# Patient Record
Sex: Female | Born: 1970
Health system: Southern US, Community
[De-identification: ages and names within clinical notes are randomized; demographics above are authoritative.]

## PROBLEM LIST (undated history)

## (undated) DIAGNOSIS — G5603 Carpal tunnel syndrome, bilateral upper limbs: Secondary | ICD-10-CM

## (undated) DIAGNOSIS — M1990 Unspecified osteoarthritis, unspecified site: Secondary | ICD-10-CM

## (undated) DIAGNOSIS — T7840XA Allergy, unspecified, initial encounter: Secondary | ICD-10-CM

## (undated) HISTORY — DX: Carpal tunnel syndrome, bilateral upper limbs: G56.03

## (undated) HISTORY — DX: Unspecified osteoarthritis, unspecified site: M19.90

## (undated) HISTORY — PX: OTHER SURGICAL HISTORY: SHX169

## (undated) HISTORY — DX: Allergy, unspecified, initial encounter: T78.40XA

---

## 2002-12-13 ENCOUNTER — Encounter: Payer: Self-pay | Admitting: Unknown Physician Specialty

## 2002-12-13 ENCOUNTER — Ambulatory Visit (HOSPITAL_COMMUNITY): Admission: RE | Admit: 2002-12-13 | Discharge: 2002-12-13 | Payer: Self-pay | Admitting: Unknown Physician Specialty

## 2016-02-12 DIAGNOSIS — G5600 Carpal tunnel syndrome, unspecified upper limb: Secondary | ICD-10-CM | POA: Diagnosis not present

## 2016-07-19 ENCOUNTER — Ambulatory Visit (INDEPENDENT_AMBULATORY_CARE_PROVIDER_SITE_OTHER): Payer: BLUE CROSS/BLUE SHIELD | Admitting: Physician Assistant

## 2016-07-19 ENCOUNTER — Encounter: Payer: Self-pay | Admitting: Physician Assistant

## 2016-07-19 ENCOUNTER — Encounter (INDEPENDENT_AMBULATORY_CARE_PROVIDER_SITE_OTHER): Payer: Self-pay

## 2016-07-19 VITALS — BP 103/76 | HR 62 | Temp 97.3°F | Ht 61.0 in | Wt 246.6 lb

## 2016-07-19 DIAGNOSIS — Z23 Encounter for immunization: Secondary | ICD-10-CM

## 2016-07-19 DIAGNOSIS — A084 Viral intestinal infection, unspecified: Secondary | ICD-10-CM

## 2016-07-19 DIAGNOSIS — J301 Allergic rhinitis due to pollen: Secondary | ICD-10-CM

## 2016-07-19 NOTE — Patient Instructions (Signed)
DASH Eating Plan  DASH stands for "Dietary Approaches to Stop Hypertension." The DASH eating plan is a healthy eating plan that has been shown to reduce high blood pressure (hypertension). Additional health benefits may include reducing the risk of type 2 diabetes mellitus, heart disease, and stroke. The DASH eating plan may also help with weight loss.  WHAT DO I NEED TO KNOW ABOUT THE DASH EATING PLAN?  For the DASH eating plan, you will follow these general guidelines:  · Choose foods with a percent daily value for sodium of less than 5% (as listed on the food label).  · Use salt-free seasonings or herbs instead of table salt or sea salt.  · Check with your health care provider or pharmacist before using salt substitutes.  · Eat lower-sodium products, often labeled as "lower sodium" or "no salt added."  · Eat fresh foods.  · Eat more vegetables, fruits, and low-fat dairy products.  · Choose whole grains. Look for the word "whole" as the first word in the ingredient list.  · Choose fish and skinless chicken or turkey more often than red meat. Limit fish, poultry, and meat to 6 oz (170 g) each day.  · Limit sweets, desserts, sugars, and sugary drinks.  · Choose heart-healthy fats.  · Limit cheese to 1 oz (28 g) per day.  · Eat more home-cooked food and less restaurant, buffet, and fast food.  · Limit fried foods.  · Cook foods using methods other than frying.  · Limit canned vegetables. If you do use them, rinse them well to decrease the sodium.  · When eating at a restaurant, ask that your food be prepared with less salt, or no salt if possible.  WHAT FOODS CAN I EAT?  Seek help from a dietitian for individual calorie needs.  Grains  Whole grain or whole wheat bread. Brown rice. Whole grain or whole wheat pasta. Quinoa, bulgur, and whole grain cereals. Low-sodium cereals. Corn or whole wheat flour tortillas. Whole grain cornbread. Whole grain crackers. Low-sodium crackers.  Vegetables  Fresh or frozen vegetables  (raw, steamed, roasted, or grilled). Low-sodium or reduced-sodium tomato and vegetable juices. Low-sodium or reduced-sodium tomato sauce and paste. Low-sodium or reduced-sodium canned vegetables.   Fruits  All fresh, canned (in natural juice), or frozen fruits.  Meat and Other Protein Products  Ground beef (85% or leaner), grass-fed beef, or beef trimmed of fat. Skinless chicken or turkey. Ground chicken or turkey. Pork trimmed of fat. All fish and seafood. Eggs. Dried beans, peas, or lentils. Unsalted nuts and seeds. Unsalted canned beans.  Dairy  Low-fat dairy products, such as skim or 1% milk, 2% or reduced-fat cheeses, low-fat ricotta or cottage cheese, or plain low-fat yogurt. Low-sodium or reduced-sodium cheeses.  Fats and Oils  Tub margarines without trans fats. Light or reduced-fat mayonnaise and salad dressings (reduced sodium). Avocado. Safflower, olive, or canola oils. Natural peanut or almond butter.  Other  Unsalted popcorn and pretzels.  The items listed above may not be a complete list of recommended foods or beverages. Contact your dietitian for more options.  WHAT FOODS ARE NOT RECOMMENDED?  Grains  White bread. White pasta. White rice. Refined cornbread. Bagels and croissants. Crackers that contain trans fat.  Vegetables  Creamed or fried vegetables. Vegetables in a cheese sauce. Regular canned vegetables. Regular canned tomato sauce and paste. Regular tomato and vegetable juices.  Fruits  Dried fruits. Canned fruit in light or heavy syrup. Fruit juice.  Meat and Other Protein   Products  Fatty cuts of meat. Ribs, chicken wings, bacon, sausage, bologna, salami, chitterlings, fatback, hot dogs, bratwurst, and packaged luncheon meats. Salted nuts and seeds. Canned beans with salt.  Dairy  Whole or 2% milk, cream, half-and-half, and cream cheese. Whole-fat or sweetened yogurt. Full-fat cheeses or blue cheese. Nondairy creamers and whipped toppings. Processed cheese, cheese spreads, or cheese  curds.  Condiments  Onion and garlic salt, seasoned salt, table salt, and sea salt. Canned and packaged gravies. Worcestershire sauce. Tartar sauce. Barbecue sauce. Teriyaki sauce. Soy sauce, including reduced sodium. Steak sauce. Fish sauce. Oyster sauce. Cocktail sauce. Horseradish. Ketchup and mustard. Meat flavorings and tenderizers. Bouillon cubes. Hot sauce. Tabasco sauce. Marinades. Taco seasonings. Relishes.  Fats and Oils  Butter, stick margarine, lard, shortening, ghee, and bacon fat. Coconut, palm kernel, or palm oils. Regular salad dressings.  Other  Pickles and olives. Salted popcorn and pretzels.  The items listed above may not be a complete list of foods and beverages to avoid. Contact your dietitian for more information.  WHERE CAN I FIND MORE INFORMATION?  National Heart, Lung, and Blood Institute: www.nhlbi.nih.gov/health/health-topics/topics/dash/     This information is not intended to replace advice given to you by your health care provider. Make sure you discuss any questions you have with your health care provider.     Document Released: 09/16/2011 Document Revised: 10/18/2014 Document Reviewed: 08/01/2013  Elsevier Interactive Patient Education ©2016 Elsevier Inc.

## 2016-07-19 NOTE — Progress Notes (Signed)
BP 103/76   Pulse 62   Temp 97.3 F (36.3 C) (Oral)   Ht 5\' 1"  (1.549 m)   Wt 246 lb 9.6 oz (111.9 kg)   LMP 07/05/2016   BMI 46.59 kg/m    Subjective:    Patient ID: Erica Galloway, female    DOB: 08/08/1971, 45 y.o.   MRN: [id number]  Erica Galloway is a 45 y.o. female presenting on 07/19/2016 for Establish Care (Patient was out sick Wednesday at work with stomach virus. Discuss right ear )  HPI Patient here to be established as new patient at [facility].  This patient is known to me from [facility]. Had a stomach virus last week and had to leave work due to vomiting.  The day before she had several fever and chills episodes.  She is doing better now.  H  Otherwise she is doing well with medications and conditions.  She has no oher complaints today. Her hip and thumb pain have been controlled through exercise and resting.    Relevant past medical, surgical, family and social history reviewed and updated as indicated. Interim medical history since our last visit reviewed. Allergies and medications reviewed and updated.   Data reviewed from any sources in EPIC.  Review of Systems  Constitutional: Negative.  Negative for activity change, fatigue and fever.  HENT: Negative.  Negative for ear discharge and ear pain.   Eyes: Negative.   Respiratory: Negative.  Negative for cough.   Cardiovascular: Negative.  Negative for chest pain.  Gastrointestinal: Negative.  Negative for abdominal pain.  Endocrine: Negative.   Genitourinary: Negative.  Negative for dysuria.  Musculoskeletal: Positive for arthralgias, back pain and joint swelling.  Skin: Negative.   Neurological: Negative.     Per HPI unless specifically indicated above  Social History   Social History  . Marital status: Married    Spouse name: N/A  . Number of children: N/A  . Years of education: N/A   Occupational History  . Not on file.   Social History Main Topics  . Smoking  status: Never Smoker  . Smokeless tobacco: Never Used  . Alcohol use No  . Drug use: No  . Sexual activity: Not on file   Other Topics Concern  . Not on file   Social History Narrative  . No narrative on file    Past Surgical History:  Procedure Laterality Date  . right ankle surgery      Family History  Problem Relation Age of Onset  . Cancer Mother     Breast   . Hyperlipidemia Father       Medication List       Accurate as of 07/19/16 11:56 AM. Always use your most recent med list.          cetirizine 10 MG tablet Commonly known as:  ZYRTEC Take 10 mg by mouth daily.   clobetasol cream 0.05 % Commonly known as:  TEMOVATE Apply 1 application topically 2 (two) times daily.   fluticasone 50 MCG/ACT nasal spray Commonly known as:  FLONASE Place into both nostrils daily as needed for allergies or rhinitis.   PROBIOTIC-10 PO Take by mouth.   vitamin C 1000 MG tablet Take 1,000 mg by mouth daily.          Objective:    BP 103/76   Pulse 62   Temp 97.3 F (36.3 C) (Oral)   Ht 5\' 1"  (1.549 m)  Wt 246 lb 9.6 oz (111.9 kg)   LMP 07/05/2016   BMI 46.59 kg/m   Allergies  Allergen Reactions  . Sulfur    Wt Readings from Last 3 Encounters:  07/19/16 246 lb 9.6 oz (111.9 kg)    Physical Exam  Constitutional: She is oriented to person, place, and time. She appears well-developed and well-nourished.  HENT:  Head: Normocephalic and atraumatic.  Eyes: Conjunctivae and EOM are normal. Pupils are equal, round, and reactive to light.  Cardiovascular: Normal rate, regular rhythm, normal heart sounds and intact distal pulses.   Pulmonary/Chest: Effort normal and breath sounds normal.  Abdominal: Soft. Bowel sounds are normal. There is no tenderness. There is no guarding.  Musculoskeletal: She exhibits tenderness.  Neurological: She is alert and oriented to person, place, and time. She has normal reflexes.  Skin: Skin is warm and dry. No rash noted.    Psychiatric: She has a normal mood and affect. Her behavior is normal. Judgment and thought content normal.      Assessment & Plan:   1. Viral gastroenteritis Work note given, missed 07/14/16 due to vomitins  2. Allergic rhinitis due to pollen, unspecified chronicity, unspecified seasonality Continue flonase and zyrtec  3. Morbid obesity (HCC) DASH diet information given   Continue all other maintenance medications as listed above. Educational handout given for DASH diet  Follow up plan: Return if symptoms worsen or fail to improve.  Remus LofflerAngel S. Endora Teresi PA-C [facility] [address]  [place], [place] [address] [phone]   07/19/2016, 11:56 AM

## 2016-08-25 ENCOUNTER — Encounter: Payer: Self-pay | Admitting: Physician Assistant

## 2016-08-25 ENCOUNTER — Ambulatory Visit (INDEPENDENT_AMBULATORY_CARE_PROVIDER_SITE_OTHER): Payer: BLUE CROSS/BLUE SHIELD | Admitting: Physician Assistant

## 2016-08-25 VITALS — BP 108/69 | HR 84 | Temp 97.8°F | Ht 61.0 in | Wt 236.6 lb

## 2016-08-25 DIAGNOSIS — F902 Attention-deficit hyperactivity disorder, combined type: Secondary | ICD-10-CM | POA: Diagnosis not present

## 2016-08-25 MED ORDER — METHYLPHENIDATE HCL ER (OSM) 27 MG PO TBCR: 27.0000 mg | EXTENDED_RELEASE_TABLET | ORAL | 0 refills | Status: DC

## 2016-08-25 NOTE — Progress Notes (Signed)
BP 108/69   Pulse 84   Temp 97.8 F (36.6 C) (Oral)   Ht 5\' 1"  (1.549 m)   Wt 236 lb 9.6 oz (107.3 kg)   BMI 44.71 kg/m    Subjective:    Patient ID: Erica Galloway, female    DOB: 26-Oct-1970, 45 y.o.   MRN: [id number]  HPI: Erica Galloway is a 45 y.o. female presenting on 08/25/2016 for Medication Refill (Patient would like to discuss going back on concerta )  This patient has a positive diagnosis for ADHD. She had worked a job where she did not have to have such precise activities and the attention to detail as she does now. Therefore she has not been sent for the past couple years. That she feels that she needs to restart it. She has gotten some complaints from her managers at work about her following instructions and completing things in a certain way and with accuracy in measurements. There are no other complaints at this time  Relevant past medical, surgical, family and social history reviewed and updated as indicated. Allergies and medications reviewed and updated.  Past Medical History:  Diagnosis Date  . Allergy   . Arthritis   . Carpal tunnel syndrome, bilateral     Past Surgical History:  Procedure Laterality Date  . right ankle surgery      Review of Systems  Constitutional: Negative.  Negative for activity change, fatigue and fever.  HENT: Negative.   Eyes: Negative.   Respiratory: Negative.  Negative for cough.   Cardiovascular: Negative.  Negative for chest pain.  Gastrointestinal: Negative.  Negative for abdominal pain.  Endocrine: Negative.   Genitourinary: Negative.  Negative for dysuria.  Musculoskeletal: Negative.   Skin: Negative.   Neurological: Negative.   Psychiatric/Behavioral: Positive for decreased concentration. Negative for behavioral problems and confusion.      Medication List       Accurate as of 08/25/16  4:24 PM. Always use your most recent med list.          cetirizine 10 MG tablet Commonly known as:  ZYRTEC Take 10 mg by  mouth daily.   clobetasol cream 0.05 % Commonly known as:  TEMOVATE Apply 1 application topically 2 (two) times daily.   fluticasone 50 MCG/ACT nasal spray Commonly known as:  FLONASE Place into both nostrils daily as needed for allergies or rhinitis.   methylphenidate 27 MG CR tablet Commonly known as:  CONCERTA Take 1-2 tablets (27-54 mg total) by mouth every morning.   PROBIOTIC-10 PO Take by mouth.   vitamin C 1000 MG tablet Take 1,000 mg by mouth daily.          Objective:    BP 108/69   Pulse 84   Temp 97.8 F (36.6 C) (Oral)   Ht 5\' 1"  (1.549 m)   Wt 236 lb 9.6 oz (107.3 kg)   BMI 44.71 kg/m   Allergies  Allergen Reactions  . Sulfur     Physical Exam  Constitutional: She is oriented to person, place, and time. She appears well-developed and well-nourished.  HENT:  Head: Normocephalic and atraumatic.  Eyes: Conjunctivae and EOM are normal. Pupils are equal, round, and reactive to light.  Cardiovascular: Normal rate, regular rhythm, normal heart sounds and intact distal pulses.   Pulmonary/Chest: Effort normal and breath sounds normal.  Abdominal: Soft. Bowel sounds are normal.  Neurological: She is alert and oriented to person, place, and time. She has normal reflexes.  Skin: Skin  is warm and dry. No rash noted.  Psychiatric: She has a normal mood and affect. Her behavior is normal. Judgment and thought content normal.        Assessment & Plan:   1. Attention deficit hyperactivity disorder (ADHD), combined type - methylphenidate 27 MG PO CR tablet; Take 1-2 tablets (27-54 mg total) by mouth every morning.  Dispense: 60 tablet; Refill: 0 CONTRACT completed  Continue all other maintenance medications as listed above.  Follow up plan: Return in about 4 weeks (around 09/22/2016) for recheck meds.  Educational handout given for ADHD  Remus LofflerAngel S. Shawntay Prest PA-C [facility] [address]  [place], [place]  [address] [phone]   08/25/2016, 4:24 PM

## 2016-08-25 NOTE — Patient Instructions (Signed)

## 2016-09-21 ENCOUNTER — Ambulatory Visit (INDEPENDENT_AMBULATORY_CARE_PROVIDER_SITE_OTHER): Payer: BLUE CROSS/BLUE SHIELD | Admitting: Physician Assistant

## 2016-09-21 ENCOUNTER — Encounter: Payer: Self-pay | Admitting: Physician Assistant

## 2016-09-21 VITALS — BP 127/80 | HR 86 | Temp 98.3°F | Ht 61.0 in | Wt 224.0 lb

## 2016-09-21 DIAGNOSIS — F902 Attention-deficit hyperactivity disorder, combined type: Secondary | ICD-10-CM | POA: Diagnosis not present

## 2016-09-21 MED ORDER — METHYLPHENIDATE HCL ER (OSM) 54 MG PO TBCR: 54.0000 mg | EXTENDED_RELEASE_TABLET | ORAL | 0 refills | Status: DC

## 2016-09-21 NOTE — Progress Notes (Signed)
BP 127/80   Pulse 86   Temp 98.3 F (36.8 C) (Oral)   Ht 5\' 1"  (1.549 m)   Wt 224 lb (101.6 kg)   BMI 42.32 kg/m    Subjective:    Patient ID: Erica HopesKathy F Guerette, female    DOB: 05/20/1971, 45 y.o.   MRN: [id number]  HPI: Erica Galloway is a 45 y.o. female presenting on 09/21/2016 for Follow-up  This patient comes in for periodic recheck on medications and conditions of ADHD. All medications are reviewed today. There are no reports of any problems with the medications. All of the medical conditions are reviewed and updated.  Lab work is reviewed and will be ordered as medically necessary. There are no new problems reported with today's visit.    Relevant past medical, surgical, family and social history reviewed and updated as indicated. Allergies and medications reviewed and updated.  Past Medical History:  Diagnosis Date  . Allergy   . Arthritis   . Carpal tunnel syndrome, bilateral     Past Surgical History:  Procedure Laterality Date  . right ankle surgery      Review of Systems  Constitutional: Negative.  Negative for activity change, fatigue and fever.  HENT: Negative.   Eyes: Negative.   Respiratory: Negative.  Negative for cough.   Cardiovascular: Negative.  Negative for chest pain.  Gastrointestinal: Negative.  Negative for abdominal pain.  Endocrine: Negative.   Genitourinary: Negative.  Negative for dysuria.  Musculoskeletal: Negative.   Skin: Negative.   Neurological: Negative.       Medication List       Accurate as of 09/21/16  4:15 PM. Always use your most recent med list.          cetirizine 10 MG tablet Commonly known as:  ZYRTEC Take 10 mg by mouth daily.   clobetasol cream 0.05 % Commonly known as:  TEMOVATE Apply 1 application topically 2 (two) times daily.   fluticasone 50 MCG/ACT nasal spray Commonly known as:  FLONASE Place into both nostrils daily as needed for allergies or rhinitis.   methylphenidate 54 MG CR tablet Commonly  known as:  CONCERTA Take 1 tablet (54 mg total) by mouth every morning.   methylphenidate 54 MG CR tablet Commonly known as:  CONCERTA Take 1 tablet (54 mg total) by mouth every morning.   methylphenidate 54 MG CR tablet Commonly known as:  CONCERTA Take 1 tablet (54 mg total) by mouth every morning.   PROBIOTIC-10 PO Take by mouth.   vitamin C 1000 MG tablet Take 1,000 mg by mouth daily.          Objective:    BP 127/80   Pulse 86   Temp 98.3 F (36.8 C) (Oral)   Ht 5\' 1"  (1.549 m)   Wt 224 lb (101.6 kg)   BMI 42.32 kg/m   Allergies  Allergen Reactions  . Sulfur     Physical Exam  Constitutional: She is oriented to person, place, and time. She appears well-developed and well-nourished.  HENT:  Head: Normocephalic and atraumatic.  Eyes: Conjunctivae and EOM are normal. Pupils are equal, round, and reactive to light.  Cardiovascular: Normal rate, regular rhythm, normal heart sounds and intact distal pulses.   Pulmonary/Chest: Effort normal and breath sounds normal.  Abdominal: Soft. Bowel sounds are normal.  Neurological: She is alert and oriented to person, place, and time. She has normal reflexes.  Skin: Skin is warm and dry. No rash noted.  Psychiatric:  She has a normal mood and affect. Her behavior is normal. Judgment and thought content normal.       Assessment & Plan:   1. Attention deficit hyperactivity disorder (ADHD), combined type - methylphenidate 54 MG PO CR tablet; Take 1 tablet (54 mg total) by mouth every morning.  Dispense: 30 tablet; Refill: 0 - methylphenidate 54 MG PO CR tablet; Take 1 tablet (54 mg total) by mouth every morning.  Dispense: 30 tablet; Refill: 0 - methylphenidate 54 MG PO CR tablet; Take 1 tablet (54 mg total) by mouth every morning.  Dispense: 30 tablet; Refill: 0   Continue all other maintenance medications as listed above.  Follow up plan: Recheck 3 months.  Educational handout given for ADHD  Remus LofflerAngel S. Ellakate Gonsalves  PA-C [facility] [address]  [place], [place] [address] [phone]   09/21/2016, 4:15 PM

## 2016-09-21 NOTE — Patient Instructions (Signed)

## 2016-10-18 ENCOUNTER — Ambulatory Visit (INDEPENDENT_AMBULATORY_CARE_PROVIDER_SITE_OTHER): Payer: BLUE CROSS/BLUE SHIELD | Admitting: Physician Assistant

## 2016-10-18 ENCOUNTER — Encounter: Payer: Self-pay | Admitting: Physician Assistant

## 2016-10-18 VITALS — BP 121/77 | HR 93 | Temp 96.7°F | Ht 61.0 in | Wt 215.4 lb

## 2016-10-18 DIAGNOSIS — R059 Cough, unspecified: Secondary | ICD-10-CM | POA: Insufficient documentation

## 2016-10-18 DIAGNOSIS — R1114 Bilious vomiting: Secondary | ICD-10-CM | POA: Diagnosis not present

## 2016-10-18 DIAGNOSIS — J0100 Acute maxillary sinusitis, unspecified: Secondary | ICD-10-CM

## 2016-10-18 DIAGNOSIS — R05 Cough: Secondary | ICD-10-CM | POA: Diagnosis not present

## 2016-10-18 MED ORDER — HYDROCODONE-HOMATROPINE 5-1.5 MG/5ML PO SYRP: 5.0000 mL | ORAL_SOLUTION | Freq: Four times a day (QID) | ORAL | 0 refills | Status: DC | PRN

## 2016-10-18 MED ORDER — AMOXICILLIN 500 MG PO CAPS: 1000.0000 mg | ORAL_CAPSULE | Freq: Two times a day (BID) | ORAL | 0 refills | Status: DC

## 2016-10-18 MED ORDER — PROMETHAZINE HCL 25 MG PO TABS: 25.0000 mg | ORAL_TABLET | Freq: Four times a day (QID) | ORAL | 0 refills | Status: DC | PRN

## 2016-10-18 NOTE — Progress Notes (Signed)
BP 121/77   Pulse 93   Temp (!) 96.7 F (35.9 C) (Oral)   Ht 5\' 1"  (1.549 m)   Wt 215 lb 6.4 oz (97.7 kg)   BMI 40.70 kg/m    Subjective:    Patient ID: Erica Galloway, female    DOB: 1971/06/15, 46 y.o.   MRN: [id number]  HPI: Erica Galloway is a 46 y.o. female presenting on 10/18/2016 for Cough; Headache; and Emesis  Patient is an sick for approximately 4 days. She has had increased sinus pressure and drainage. She started the first couple days with severe nausea and vomiting. She felt that she was getting some better and was trying to go to work today. When she got there she had more vomiting. She has had some fever throughout. She states that the drainage is green in color. She denies any blood at this time. She is a very severe cough throughout the day.  Relevant past medical, surgical, family and social history reviewed and updated as indicated. Allergies and medications reviewed and updated.  Past Medical History:  Diagnosis Date  . Allergy   . Arthritis   . Carpal tunnel syndrome, bilateral     Past Surgical History:  Procedure Laterality Date  . right ankle surgery      Review of Systems  Constitutional: Positive for chills and fatigue. Negative for activity change and appetite change.  HENT: Positive for congestion, postnasal drip and sore throat.   Eyes: Negative.   Respiratory: Positive for cough and wheezing.   Cardiovascular: Negative.  Negative for chest pain, palpitations and leg swelling.  Gastrointestinal: Negative.   Genitourinary: Negative.   Musculoskeletal: Negative.   Skin: Negative.   Neurological: Positive for headaches.    Allergies as of 10/18/2016      Reactions   Sulfur       Medication List       Accurate as of 10/18/16  8:58 PM. Always use your most recent med list.          amoxicillin 500 MG capsule Commonly known as:  AMOXIL Take 2 capsules (1,000 mg total) by mouth 2 (two) times daily.   cetirizine 10 MG tablet Commonly  known as:  ZYRTEC Take 10 mg by mouth daily.   clobetasol cream 0.05 % Commonly known as:  TEMOVATE Apply 1 application topically 2 (two) times daily.   fluticasone 50 MCG/ACT nasal spray Commonly known as:  FLONASE Place into both nostrils daily as needed for allergies or rhinitis.   HYDROcodone-homatropine 5-1.5 MG/5ML syrup Commonly known as:  HYCODAN Take 5-10 mLs by mouth every 6 (six) hours as needed.   methylphenidate 54 MG CR tablet Commonly known as:  CONCERTA Take 1 tablet (54 mg total) by mouth every morning.   methylphenidate 54 MG CR tablet Commonly known as:  CONCERTA Take 1 tablet (54 mg total) by mouth every morning.   methylphenidate 54 MG CR tablet Commonly known as:  CONCERTA Take 1 tablet (54 mg total) by mouth every morning.   PROBIOTIC-10 PO Take by mouth.   promethazine 25 MG tablet Commonly known as:  PHENERGAN Take 1 tablet (25 mg total) by mouth every 6 (six) hours as needed for nausea or vomiting.   vitamin C 1000 MG tablet Take 1,000 mg by mouth daily.          Objective:    BP 121/77   Pulse 93   Temp (!) 96.7 F (35.9 C) (Oral)   Ht 5'  1" (1.549 m)   Wt 215 lb 6.4 oz (97.7 kg)   BMI 40.70 kg/m   Allergies  Allergen Reactions  . Sulfur     Physical Exam  Constitutional: She is oriented to person, place, and time. She appears well-developed and well-nourished.  HENT:  Head: Normocephalic and atraumatic.  Right Ear: Tympanic membrane and external ear normal. No middle ear effusion.  Left Ear: Tympanic membrane and external ear normal.  No middle ear effusion.  Nose: Mucosal edema and rhinorrhea present. Right sinus exhibits no maxillary sinus tenderness. Left sinus exhibits no maxillary sinus tenderness.  Mouth/Throat: Uvula is midline. Posterior oropharyngeal erythema present.  Eyes: Conjunctivae and EOM are normal. Pupils are equal, round, and reactive to light. Right eye exhibits no discharge. Left eye exhibits no  discharge.  Neck: Normal range of motion.  Cardiovascular: Normal rate, regular rhythm and normal heart sounds.   Pulmonary/Chest: Effort normal and breath sounds normal. No respiratory distress. She has no wheezes.  Abdominal: Soft.  Lymphadenopathy:    She has no cervical adenopathy.  Neurological: She is alert and oriented to person, place, and time.  Skin: Skin is warm and dry.  Psychiatric: She has a normal mood and affect.    No results found for this or any previous visit.    Assessment & Plan:   1. Acute non-recurrent maxillary sinusitis - amoxicillin (AMOXIL) 500 MG capsule; Take 2 capsules (1,000 mg total) by mouth 2 (two) times daily.  Dispense: 40 capsule; Refill: 0  2. Bilious vomiting with nausea - promethazine (PHENERGAN) 25 MG tablet; Take 1 tablet (25 mg total) by mouth every 6 (six) hours as needed for nausea or vomiting.  Dispense: 30 tablet; Refill: 0  3. Cough - HYDROcodone-homatropine (HYCODAN) 5-1.5 MG/5ML syrup; Take 5-10 mLs by mouth every 6 (six) hours as needed.  Dispense: 240 mL; Refill: 0   Continue all other maintenance medications as listed above.  Follow up plan: Return if symptoms worsen or fail to improve.  No orders of the defined types were placed in this encounter.   Educational handout given for sinusitis  Remus LofflerAngel S. Nyssa Sayegh PA-C [facility] [address]  [place], [place] [address] [phone]   10/18/2016, 8:58 PM

## 2016-10-18 NOTE — Patient Instructions (Signed)

## 2016-11-08 ENCOUNTER — Encounter: Payer: Self-pay | Admitting: Physician Assistant

## 2016-11-08 ENCOUNTER — Ambulatory Visit (INDEPENDENT_AMBULATORY_CARE_PROVIDER_SITE_OTHER): Payer: BLUE CROSS/BLUE SHIELD | Admitting: Physician Assistant

## 2016-11-08 VITALS — BP 123/75 | HR 92 | Temp 97.4°F | Ht 61.0 in | Wt 209.6 lb

## 2016-11-08 DIAGNOSIS — N898 Other specified noninflammatory disorders of vagina: Secondary | ICD-10-CM

## 2016-11-08 DIAGNOSIS — K649 Unspecified hemorrhoids: Secondary | ICD-10-CM | POA: Diagnosis not present

## 2016-11-08 LAB — WET PREP FOR TRICH, YEAST, CLUE
Clue Cell Exam: POSITIVE — AB
TRICHOMONAS EXAM: NEGATIVE
Yeast Exam: NEGATIVE

## 2016-11-08 MED ORDER — METRONIDAZOLE 500 MG PO TABS: 500.0000 mg | ORAL_TABLET | Freq: Three times a day (TID) | ORAL | 0 refills | Status: DC

## 2016-11-08 MED ORDER — HYDROCORTISONE ACETATE 25 MG RE SUPP: 25.0000 mg | Freq: Two times a day (BID) | RECTAL | 0 refills | Status: DC

## 2016-11-08 NOTE — Patient Instructions (Signed)
Hemorrhoids Hemorrhoids are swollen veins in and around the rectum or anus. There are two types of hemorrhoids:  Internal hemorrhoids. These occur in the veins that are just inside the rectum. They may poke through to the outside and become irritated and painful.  External hemorrhoids. These occur in the veins that are outside of the anus and can be felt as a painful swelling or hard lump near the anus.  Most hemorrhoids do not cause serious problems, and they can be managed with home treatments such as diet and lifestyle changes. If home treatments do not help your symptoms, procedures can be done to shrink or remove the hemorrhoids. What are the causes? This condition is caused by increased pressure in the anal area. This pressure may result from various things, including:  Constipation.  Straining to have a bowel movement.  Diarrhea.  Pregnancy.  Obesity.  Sitting for long periods of time.  Heavy lifting or other activity that causes you to strain.  Anal sex.  What are the signs or symptoms? Symptoms of this condition include:  Pain.  Anal itching or irritation.  Rectal bleeding.  Leakage of stool (feces).  Anal swelling.  One or more lumps around the anus.  How is this diagnosed? This condition can often be diagnosed through a visual exam. Other exams or tests may also be done, such as:  Examination of the rectal area with a gloved hand (digital rectal exam).  Examination of the anal canal using a small tube (anoscope).  A blood test, if you have lost a significant amount of blood.  A test to look inside the colon (sigmoidoscopy or colonoscopy).  How is this treated? This condition can usually be treated at home. However, various procedures may be done if dietary changes, lifestyle changes, and other home treatments do not help your symptoms. These procedures can help make the hemorrhoids smaller or remove them completely. Some of these procedures involve  surgery, and others do not. Common procedures include:  Rubber band ligation. Rubber bands are placed at the base of the hemorrhoids to cut off the blood supply to them.  Sclerotherapy. Medicine is injected into the hemorrhoids to shrink them.  Infrared coagulation. A type of light energy is used to get rid of the hemorrhoids.  Hemorrhoidectomy surgery. The hemorrhoids are surgically removed, and the veins that supply them are tied off.  Stapled hemorrhoidopexy surgery. A circular stapling device is used to remove the hemorrhoids and use staples to cut off the blood supply to them.  Follow these instructions at home: Eating and drinking  Eat foods that have a lot of fiber in them, such as whole grains, beans, nuts, fruits, and vegetables. Ask your health care provider about taking products that have added fiber (fiber supplements).  Drink enough fluid to keep your urine clear or pale yellow. Managing pain and swelling  Take warm sitz baths for 20 minutes, 3-4 times a day to ease pain and discomfort.  If directed, apply ice to the affected area. Using ice packs between sitz baths may be helpful. ? Put ice in a plastic bag. ? Place a towel between your skin and the bag. ? Leave the ice on for 20 minutes, 2-3 times a day. General instructions  Take over-the-counter and prescription medicines only as told by your health care provider.  Use medicated creams or suppositories as told.  Exercise regularly.  Go to the bathroom when you have the urge to have a bowel movement. Do not wait.    Avoid straining to have bowel movements.  Keep the anal area dry and clean. Use wet toilet paper or moist towelettes after a bowel movement.  Do not sit on the toilet for long periods of time. This increases blood pooling and pain. Contact a health care provider if:  You have increasing pain and swelling that are not controlled by treatment or medicine.  You have uncontrolled bleeding.  You  have difficulty having a bowel movement, or you are unable to have a bowel movement.  You have pain or inflammation outside the area of the hemorrhoids. This information is not intended to replace advice given to you by your health care provider. Make sure you discuss any questions you have with your health care provider. Document Released: 09/24/2000 Document Revised: 02/25/2016 Document Reviewed: 06/11/2015 Elsevier Interactive Patient Education  2017 Elsevier Inc.  

## 2016-11-10 NOTE — Progress Notes (Signed)
BP 123/75   Pulse 92   Temp 97.4 F (36.3 C) (Oral)   Ht 5\' 1"  (1.549 m)   Wt 209 lb 9.6 oz (95.1 kg)   BMI 39.60 kg/m    Subjective:    Patient ID: Erica Galloway, female    DOB: [birth year], 46 y.o.   MRN: [id number]  HPI: Erica Galloway is a 46 y.o. female presenting on 11/08/2016 for Vaginal Discharge Patient comes in with approximately 2 weeks of vaginal discharge. She stated she had a small amount before her menstrual cycle. During the week of her menstrual cycle it seemed to go away. Then since then she has had it come back again. She denies any fever or chills. She has some itching at times. In addition she has a small hemorrhoid that is aggravated when she has a bowel movement. She has had a distant history of hemorrhoid in the past  Relevant past medical, surgical, family and social history reviewed and updated as indicated. Allergies and medications reviewed and updated.  Past Medical History:  Diagnosis Date  . Allergy   . Arthritis   . Carpal tunnel syndrome, bilateral     Past Surgical History:  Procedure Laterality Date  . right ankle surgery      Review of Systems  Constitutional: Negative.  Negative for activity change, fatigue and fever.  HENT: Negative.   Eyes: Negative.   Respiratory: Negative.  Negative for cough.   Cardiovascular: Negative.  Negative for chest pain.  Gastrointestinal: Negative.  Negative for abdominal pain.  Endocrine: Negative.   Genitourinary: Negative.  Negative for dysuria.  Musculoskeletal: Negative.   Skin: Negative.   Neurological: Negative.     Allergies as of 11/08/2016      Reactions   Sulfur       Medication List       Accurate as of 11/08/16 11:59 PM. Always use your most recent med list.          cetirizine 10 MG tablet Commonly known as:  ZYRTEC Take 10 mg by mouth daily.   clobetasol cream 0.05 % Commonly known as:  TEMOVATE Apply 1 application topically 2 (two) times daily.   fluticasone 50 MCG/ACT  nasal spray Commonly known as:  FLONASE Place into both nostrils daily as needed for allergies or rhinitis.   HYDROcodone-homatropine 5-1.5 MG/5ML syrup Commonly known as:  HYCODAN Take 5-10 mLs by mouth every 6 (six) hours as needed.   hydrocortisone 25 MG suppository Commonly known as:  ANUSOL-HC Place 1 suppository (25 mg total) rectally 2 (two) times daily.   methylphenidate 54 MG CR tablet Commonly known as:  CONCERTA Take 1 tablet (54 mg total) by mouth every morning.   methylphenidate 54 MG CR tablet Commonly known as:  CONCERTA Take 1 tablet (54 mg total) by mouth every morning.   methylphenidate 54 MG CR tablet Commonly known as:  CONCERTA Take 1 tablet (54 mg total) by mouth every morning.   metroNIDAZOLE 500 MG tablet Commonly known as:  FLAGYL Take 1 tablet (500 mg total) by mouth 3 (three) times daily.   PROBIOTIC-10 PO Take by mouth.   promethazine 25 MG tablet Commonly known as:  PHENERGAN Take 1 tablet (25 mg total) by mouth every 6 (six) hours as needed for nausea or vomiting.   vitamin C 1000 MG tablet Take 1,000 mg by mouth daily.          Objective:    BP 123/75   Pulse 92  Temp 97.4 F (36.3 C) (Oral)   Ht 5\' 1"  (1.549 m)   Wt 209 lb 9.6 oz (95.1 kg)   BMI 39.60 kg/m   Allergies  Allergen Reactions  . Sulfur     Physical Exam  Constitutional: She is oriented to person, place, and time. She appears well-developed and well-nourished.  HENT:  Head: Normocephalic and atraumatic.  Eyes: Conjunctivae and EOM are normal. Pupils are equal, round, and reactive to light.  Cardiovascular: Normal rate, regular rhythm, normal heart sounds and intact distal pulses.   Pulmonary/Chest: Effort normal and breath sounds normal.  Abdominal: Soft. Bowel sounds are normal.  Neurological: She is alert and oriented to person, place, and time. She has normal reflexes.  Skin: Skin is warm and dry. No rash noted.  Psychiatric: She has a normal mood and  affect. Her behavior is normal. Judgment and thought content normal.    Results for orders placed or performed in visit on 11/08/16  WET PREP FOR TRICH, YEAST, CLUE  Result Value Ref Range   Trichomonas Exam Negative Negative   Yeast Exam Negative Negative   Clue Cell Exam Positive (A) Negative      Assessment & Plan:   1. Vaginal discharge - WET PREP FOR TRICH, YEAST, CLUE - metroNIDAZOLE (FLAGYL) 500 MG tablet; Take 1 tablet (500 mg total) by mouth 3 (three) times daily.  Dispense: 21 tablet; Refill: 0  2. Hemorrhoids, unspecified hemorrhoid type - hydrocortisone (ANUSOL-HC) 25 MG suppository; Place 1 suppository (25 mg total) rectally 2 (two) times daily.  Dispense: 12 suppository; Refill: 0   Continue all other maintenance medications as listed above.  Follow up plan: Return if symptoms worsen or fail to improve.  Orders Placed This Encounter  Procedures  . WET PREP FOR TRICH, YEAST, CLUE    Educational handout given for vaginitis  Remus LofflerAngel S. Kaj Vasil PA-C [facility] [address]  [place], [place] [address] [phone]   11/10/2016, 3:43 PM

## 2016-11-17 ENCOUNTER — Telehealth: Payer: Self-pay | Admitting: Physician Assistant

## 2016-11-17 MED ORDER — CLINDAMYCIN PHOSPHATE 2 % VA CREA: 1.0000 | TOPICAL_CREAM | Freq: Every day | VAGINAL | 0 refills | Status: AC

## 2016-11-17 NOTE — Telephone Encounter (Signed)
Detailed message left for patient that new rx sent to pharmacy.

## 2016-11-17 NOTE — Telephone Encounter (Signed)
Prescription sent to pharmacy.

## 2016-12-15 ENCOUNTER — Ambulatory Visit (INDEPENDENT_AMBULATORY_CARE_PROVIDER_SITE_OTHER): Payer: BLUE CROSS/BLUE SHIELD | Admitting: Physician Assistant

## 2016-12-15 ENCOUNTER — Encounter: Payer: Self-pay | Admitting: Physician Assistant

## 2016-12-15 VITALS — BP 101/70 | HR 84 | Temp 97.3°F | Ht 61.0 in | Wt 202.0 lb

## 2016-12-15 DIAGNOSIS — F902 Attention-deficit hyperactivity disorder, combined type: Secondary | ICD-10-CM

## 2016-12-15 MED ORDER — METHYLPHENIDATE HCL ER (OSM) 54 MG PO TBCR: 54.0000 mg | EXTENDED_RELEASE_TABLET | ORAL | 0 refills | Status: DC

## 2016-12-15 MED ORDER — METRONIDAZOLE 0.75 % VA GEL: 1.0000 | Freq: Two times a day (BID) | VAGINAL | 0 refills | Status: DC

## 2016-12-15 NOTE — Patient Instructions (Signed)
In a few days you may receive a survey in the mail or online from [employer] regarding your visit with us today. Please take a moment to fill this out. Your feedback is very important to our whole office. It can help us better understand your needs as well as improve your experience and satisfaction. Thank you for taking your time to complete it. We care about you.  Willona Phariss, PA-C  

## 2016-12-17 NOTE — Progress Notes (Signed)
BP 101/70   Pulse 84   Temp 97.3 F (36.3 C) (Oral)   Ht 5\' 1"  (1.549 m)   Wt 202 lb (91.6 kg)   BMI 38.17 kg/m    Subjective:    Patient ID: Erica Galloway, female    DOB: 1971/09/24, 46 y.o.   MRN: [id number]  HPI: BRYTNI Galloway is a 46 y.o. female presenting on 12/15/2016 for Follow-up and Medication Refill  This patient comes in for periodic recheck on medications and conditions including ADHD. Reports excellent response at work and in doing things at home. Good reports from quality at work.   All medications are reviewed today. There are no reports of any problems with the medications. All of the medical conditions are reviewed and updated.  Lab work is reviewed and will be ordered as medically necessary. There are no new problems reported with today's visit.   Relevant past medical, surgical, family and social history reviewed and updated as indicated. Allergies and medications reviewed and updated.  Past Medical History:  Diagnosis Date  . Allergy   . Arthritis   . Carpal tunnel syndrome, bilateral     Past Surgical History:  Procedure Laterality Date  . right ankle surgery      Review of Systems  Constitutional: Negative.  Negative for activity change, fatigue and fever.  HENT: Negative.   Eyes: Negative.   Respiratory: Negative.  Negative for cough.   Cardiovascular: Negative.  Negative for chest pain.  Gastrointestinal: Negative.  Negative for abdominal pain.  Endocrine: Negative.   Genitourinary: Negative.  Negative for dysuria.  Musculoskeletal: Negative.   Skin: Negative.   Neurological: Negative.     Allergies as of 12/15/2016      Reactions   Sulfur       Medication List       Accurate as of 12/15/16 11:59 PM. Always use your most recent med list.          cetirizine 10 MG tablet Commonly known as:  ZYRTEC Take 10 mg by mouth daily.   clobetasol cream 0.05 % Commonly known as:  TEMOVATE Apply 1 application topically 2 (two) times  daily.   fluticasone 50 MCG/ACT nasal spray Commonly known as:  FLONASE Place into both nostrils daily as needed for allergies or rhinitis.   hydrocortisone 25 MG suppository Commonly known as:  ANUSOL-HC Place 1 suppository (25 mg total) rectally 2 (two) times daily.   methylphenidate 54 MG CR tablet Commonly known as:  CONCERTA Take 1 tablet (54 mg total) by mouth every morning.   methylphenidate 54 MG CR tablet Commonly known as:  CONCERTA Take 1 tablet (54 mg total) by mouth every morning.   methylphenidate 54 MG CR tablet Commonly known as:  CONCERTA Take 1 tablet (54 mg total) by mouth every morning.   metroNIDAZOLE 0.75 % vaginal gel Commonly known as:  METROGEL Place 1 Applicatorful vaginally 2 (two) times daily.   PROBIOTIC-10 PO Take by mouth.   promethazine 25 MG tablet Commonly known as:  PHENERGAN Take 1 tablet (25 mg total) by mouth every 6 (six) hours as needed for nausea or vomiting.   vitamin C 1000 MG tablet Take 1,000 mg by mouth daily.          Objective:    BP 101/70   Pulse 84   Temp 97.3 F (36.3 C) (Oral)   Ht 5\' 1"  (1.549 m)   Wt 202 lb (91.6 kg)   BMI 38.17 kg/m  Allergies  Allergen Reactions  . Sulfur     Physical Exam  Constitutional: She is oriented to person, place, and time. She appears well-developed and well-nourished.  HENT:  Head: Normocephalic and atraumatic.  Eyes: Conjunctivae and EOM are normal. Pupils are equal, round, and reactive to light.  Cardiovascular: Normal rate, regular rhythm, normal heart sounds and intact distal pulses.   Pulmonary/Chest: Effort normal and breath sounds normal.  Abdominal: Soft. Bowel sounds are normal.  Neurological: She is alert and oriented to person, place, and time. She has normal reflexes.  Skin: Skin is warm and dry. No rash noted.  Psychiatric: She has a normal mood and affect. Her behavior is normal. Judgment and thought content normal.        Assessment & Plan:   1.  Attention deficit hyperactivity disorder (ADHD), combined type - methylphenidate 54 MG PO CR tablet; Take 1 tablet (54 mg total) by mouth every morning.  Dispense: 30 tablet; Refill: 0 - methylphenidate 54 MG PO CR tablet; Take 1 tablet (54 mg total) by mouth every morning.  Dispense: 30 tablet; Refill: 0 - methylphenidate 54 MG PO CR tablet; Take 1 tablet (54 mg total) by mouth every morning.  Dispense: 30 tablet; Refill: 0   Continue all other maintenance medications as listed above.  Follow up plan: Return in about 3 months (around 03/17/2017) for recheck.  Educational handout given for ADHD  Remus LofflerAngel S. Cissy Galbreath PA-C [facility] [address]  [place], [place] [address] [phone]   12/17/2016, 8:47 AM

## 2016-12-23 ENCOUNTER — Telehealth: Payer: Self-pay | Admitting: Physician Assistant

## 2017-01-24 ENCOUNTER — Ambulatory Visit (INDEPENDENT_AMBULATORY_CARE_PROVIDER_SITE_OTHER): Payer: BLUE CROSS/BLUE SHIELD | Admitting: Physician Assistant

## 2017-01-24 ENCOUNTER — Encounter: Payer: Self-pay | Admitting: Physician Assistant

## 2017-01-24 VITALS — BP 115/68 | HR 86 | Temp 97.8°F | Ht 61.0 in | Wt 201.6 lb

## 2017-01-24 DIAGNOSIS — G5603 Carpal tunnel syndrome, bilateral upper limbs: Secondary | ICD-10-CM | POA: Diagnosis not present

## 2017-01-24 DIAGNOSIS — M7552 Bursitis of left shoulder: Secondary | ICD-10-CM

## 2017-01-24 MED ORDER — NAPROXEN 500 MG PO TABS: 500.0000 mg | ORAL_TABLET | Freq: Two times a day (BID) | ORAL | 1 refills | Status: DC

## 2017-01-24 NOTE — Patient Instructions (Signed)
Bursitis Bursitis is when the fluid-filled sac (bursa) that covers and protects a joint is swollen (inflamed). Bursitis is most common near joints, especially the knees, elbows, hips, and shoulders. Follow these instructions at home:  Take medicines only as told by your doctor.  If you were prescribed an antibiotic medicine, finish it all even if you start to feel better.  Rest the affected area as told by your doctor.  Keep the area raised up.  Avoid doing things that make the pain worse.  Apply ice to the injured area:  Place ice in a plastic bag.  Place a towel between your skin and the bag.  Leave the ice on for 20 minutes, 2-3 times a day.  Use splints, braces, pads, or walking aids as told by your doctor.  Keep all follow-up visits as told by your doctor. This is important. Contact a doctor if:  You have more pain with home care.  You have a fever.  You have chills. This information is not intended to replace advice given to you by your health care provider. Make sure you discuss any questions you have with your health care provider. Document Released: 03/17/2010 Document Revised: 03/04/2016 Document Reviewed: 12/17/2013 Elsevier Interactive Patient Education  2017 Elsevier Inc.  

## 2017-01-24 NOTE — Progress Notes (Signed)
BP 115/68   Pulse 86   Temp 97.8 F (36.6 C) (Oral)   Ht  (1.549 m)   Wt 201 lb 9.6 oz (91.4 kg)   BMI 38.09 kg/m    Subjective:    Patient ID: MEA OZGA, female    DOB: [birth year], 46 y.o.   MRN: [id number]  HPI: Erica Galloway is a 46 y.o. female presenting on 01/24/2017 for shoulder and neck pain (and numbness)   She has known CTS, worse in the left hand, non-dominant. At work has to do a lot of manipulation and lifting with things over head.  No specific injury known, Worsened with lifting and with arm above 90 degrees.  Wakes up with left hand numb and pain up to above elbow. Pain specifically in the anterior shoulder and click can be felt with moving.  Relevant past medical, surgical, family and social history reviewed and updated as indicated. Allergies and medications reviewed and updated.  Past Medical History:  Diagnosis Date  . Allergy   . Arthritis   . Carpal tunnel syndrome, bilateral     Past Surgical History:  Procedure Laterality Date  . right ankle surgery      Review of Systems  Constitutional: Negative.   HENT: Negative.   Eyes: Negative.   Respiratory: Negative.   Gastrointestinal: Negative.   Genitourinary: Negative.   Musculoskeletal: Positive for arthralgias and myalgias. Negative for joint swelling, neck pain and neck stiffness.    Allergies as of 01/24/2017      Reactions   Sulfur       Medication List       Accurate as of 01/24/17  9:22 PM. Always use your most recent med list.          cetirizine 10 MG tablet Commonly known as:  ZYRTEC Take 10 mg by mouth daily.   clobetasol cream 0.05 % Commonly known as:  TEMOVATE Apply 1 application topically 2 (two) times daily.   fluticasone 50 MCG/ACT nasal spray Commonly known as:  FLONASE Place into both nostrils daily as needed for allergies or rhinitis.   hydrocortisone 25 MG suppository Commonly known as:  ANUSOL-HC Place 1 suppository (25 mg total) rectally 2 (two)  times daily.   methylphenidate 54 MG CR tablet Commonly known as:  CONCERTA Take 1 tablet (54 mg total) by mouth every morning.   methylphenidate 54 MG CR tablet Commonly known as:  CONCERTA Take 1 tablet (54 mg total) by mouth every morning.   methylphenidate 54 MG CR tablet Commonly known as:  CONCERTA Take 1 tablet (54 mg total) by mouth every morning.   metroNIDAZOLE 0.75 % vaginal gel Commonly known as:  METROGEL Place 1 Applicatorful vaginally 2 (two) times daily.   naproxen 500 MG tablet Commonly known as:  NAPROSYN Take 1 tablet (500 mg total) by mouth 2 (two) times daily with a meal.   PROBIOTIC-10 PO Take by mouth.   promethazine 25 MG tablet Commonly known as:  PHENERGAN Take 1 tablet (25 mg total) by mouth every 6 (six) hours as needed for nausea or vomiting.   vitamin C 1000 MG tablet Take 1,000 mg by mouth daily.          Objective:    BP 115/68   Pulse 86   Temp 97.8 F (36.6 C) (Oral)   Ht  (1.549 m)   Wt 201 lb 9.6 oz (91.4 kg)   BMI 38.09 kg/m   Allergies  Allergen Reactions  .  Sulfur     Physical Exam  Constitutional: She is oriented to person, place, and time. She appears well-developed and well-nourished.  HENT:  Head: Normocephalic and atraumatic.  Eyes: Conjunctivae and EOM are normal. Pupils are equal, round, and reactive to light.  Cardiovascular: Normal rate, regular rhythm, normal heart sounds and intact distal pulses.   Pulmonary/Chest: Effort normal and breath sounds normal.  Abdominal: Soft. Bowel sounds are normal.  Musculoskeletal: Normal range of motion. She exhibits tenderness. She exhibits no edema or deformity.       Left shoulder: She exhibits tenderness and crepitus. She exhibits no swelling and no effusion.       Left wrist: She exhibits tenderness. She exhibits normal range of motion, no effusion, no crepitus and no deformity.       Arms: Neurological: She is alert and oriented to person, place, and time. She  has normal reflexes.  Skin: Skin is warm and dry. No rash noted.  Psychiatric: She has a normal mood and affect. Her behavior is normal. Judgment and thought content normal.        Assessment & Plan:   1. Acute bursitis of left shoulder - Ambulatory referral to Orthopedic Surgery - naproxen (NAPROSYN) 500 MG tablet; Take 1 tablet (500 mg total) by mouth 2 (two) times daily with a meal.  Dispense: 60 tablet; Refill: 1 Rest  And ice when off work  2. Bilateral carpal tunnel syndrome - naproxen (NAPROSYN) 500 MG tablet; Take 1 tablet (500 mg total) by mouth 2 (two) times daily with a meal.  Dispense: 60 tablet; Refill: 1 WEAR BRACES as much as possible  Continue all other maintenance medications as listed above.  Follow up plan: Return if symptoms worsen or fail to improve.  Educational handout given for bursitis  Remus Loffler PA-C [facility] [address]  [place], [place] [address] [phone]   01/24/2017, 9:22 PM

## 2017-03-21 ENCOUNTER — Encounter: Payer: Self-pay | Admitting: Physician Assistant

## 2017-03-21 ENCOUNTER — Ambulatory Visit (INDEPENDENT_AMBULATORY_CARE_PROVIDER_SITE_OTHER): Payer: BLUE CROSS/BLUE SHIELD | Admitting: Physician Assistant

## 2017-03-21 VITALS — BP 90/59 | HR 77 | Temp 97.5°F | Ht 61.0 in | Wt 190.0 lb

## 2017-03-21 DIAGNOSIS — M25512 Pain in left shoulder: Secondary | ICD-10-CM

## 2017-03-21 DIAGNOSIS — F902 Attention-deficit hyperactivity disorder, combined type: Secondary | ICD-10-CM | POA: Diagnosis not present

## 2017-03-21 DIAGNOSIS — M7552 Bursitis of left shoulder: Secondary | ICD-10-CM | POA: Insufficient documentation

## 2017-03-21 DIAGNOSIS — G5603 Carpal tunnel syndrome, bilateral upper limbs: Secondary | ICD-10-CM | POA: Diagnosis not present

## 2017-03-21 DIAGNOSIS — G8929 Other chronic pain: Secondary | ICD-10-CM

## 2017-03-21 MED ORDER — BUPROPION HCL ER (XL) 150 MG PO TB24: 150.0000 mg | ORAL_TABLET | Freq: Every day | ORAL | 5 refills | Status: DC

## 2017-03-21 MED ORDER — METHYLPHENIDATE HCL ER (OSM) 54 MG PO TBCR: 54.0000 mg | EXTENDED_RELEASE_TABLET | ORAL | 0 refills | Status: DC

## 2017-03-21 MED ORDER — NAPROXEN 500 MG PO TABS: 500.0000 mg | ORAL_TABLET | Freq: Two times a day (BID) | ORAL | 1 refills | Status: DC

## 2017-03-21 NOTE — Patient Instructions (Signed)
In a few days you may receive a survey in the mail or online from [employer] regarding your visit with us today. Please take a moment to fill this out. Your feedback is very important to our whole office. It can help us better understand your needs as well as improve your experience and satisfaction. Thank you for taking your time to complete it. We care about you.  Shannon Kirkendall, PA-C  

## 2017-03-21 NOTE — Progress Notes (Signed)
BP (!) 90/59   Pulse 77   Temp 97.5 F (36.4 C) (Oral)   Ht 5\' 1"  (1.549 m)   Wt 190 lb (86.2 kg)   BMI 35.90 kg/m    Subjective:    Patient ID: Erica Galloway, female    DOB: 1971/04/22, 46 y.o.   MRN: [id number]  HPI: Erica Galloway is a 46 y.o. female presenting on 03/21/2017 for ADHD (pt here today for refills on her ADHD meds)  This patient comes in for periodic recheck on medications and conditions including ADHD, reports overall doing well. She has been somewhat anxiety and remembers taking wellbutrin with good results.  She is also having increased episodes of left shoulder pain after working. Has known carpal tunnel disorder.   All medications are reviewed today. There are no reports of any problems with the medications. All of the medical conditions are reviewed and updated.  Lab work is reviewed and will be ordered as medically necessary. There are no new problems reported with today's visit.   Relevant past medical, surgical, family and social history reviewed and updated as indicated. Allergies and medications reviewed and updated.  Past Medical History:  Diagnosis Date  . Allergy   . Arthritis   . Carpal tunnel syndrome, bilateral     Past Surgical History:  Procedure Laterality Date  . right ankle surgery      Review of Systems  Constitutional: Negative.  Negative for activity change, fatigue and fever.  HENT: Negative.   Eyes: Negative.   Respiratory: Negative.  Negative for cough.   Cardiovascular: Negative.  Negative for chest pain.  Gastrointestinal: Negative.  Negative for abdominal pain.  Endocrine: Negative.   Genitourinary: Negative.  Negative for dysuria.  Musculoskeletal: Positive for arthralgias and joint swelling. Negative for back pain.  Skin: Negative.   Neurological: Negative.   Psychiatric/Behavioral: Negative.     Allergies as of 03/21/2017      Reactions   Sulfur       Medication List       Accurate as of 03/21/17  8:52 PM.  Always use your most recent med list.          buPROPion 150 MG 24 hr tablet Commonly known as:  WELLBUTRIN XL Take 1 tablet (150 mg total) by mouth daily.   cetirizine 10 MG tablet Commonly known as:  ZYRTEC Take 10 mg by mouth daily.   fluticasone 50 MCG/ACT nasal spray Commonly known as:  FLONASE Place into both nostrils daily as needed for allergies or rhinitis.   methylphenidate 54 MG CR tablet Commonly known as:  CONCERTA Take 1 tablet (54 mg total) by mouth every morning.   methylphenidate 54 MG CR tablet Commonly known as:  CONCERTA Take 1 tablet (54 mg total) by mouth every morning.   methylphenidate 54 MG CR tablet Commonly known as:  CONCERTA Take 1 tablet (54 mg total) by mouth every morning.   naproxen 500 MG tablet Commonly known as:  NAPROSYN Take 1 tablet (500 mg total) by mouth 2 (two) times daily with a meal.   promethazine 25 MG tablet Commonly known as:  PHENERGAN Take 1 tablet (25 mg total) by mouth every 6 (six) hours as needed for nausea or vomiting.   vitamin C 1000 MG tablet Take 1,000 mg by mouth daily.          Objective:    BP (!) 90/59   Pulse 77   Temp 97.5 F (36.4 C) (Oral)  Ht 5\' 1"  (1.549 m)   Wt 190 lb (86.2 kg)   BMI 35.90 kg/m   Allergies  Allergen Reactions  . Sulfur     Physical Exam  Constitutional: She is oriented to person, place, and time. She appears well-developed and well-nourished.  HENT:  Head: Normocephalic and atraumatic.  Eyes: Conjunctivae and EOM are normal. Pupils are equal, round, and reactive to light.  Cardiovascular: Normal rate, regular rhythm, normal heart sounds and intact distal pulses.   Pulmonary/Chest: Effort normal and breath sounds normal.  Abdominal: Soft. Bowel sounds are normal.  Musculoskeletal:       Left shoulder: She exhibits decreased range of motion and tenderness. She exhibits no swelling, no effusion and no crepitus.  Neurological: She is alert and oriented to person,  place, and time. She has normal reflexes.  Skin: Skin is warm and dry. No rash noted.  Psychiatric: She has a normal mood and affect. Her behavior is normal. Judgment and thought content normal.  Nursing note and vitals reviewed.       Assessment & Plan:   1. Attention deficit hyperactivity disorder (ADHD), combined type - methylphenidate 54 MG PO CR tablet; Take 1 tablet (54 mg total) by mouth every morning.  Dispense: 30 tablet; Refill: 0 - methylphenidate 54 MG PO CR tablet; Take 1 tablet (54 mg total) by mouth every morning.  Dispense: 30 tablet; Refill: 0 - methylphenidate 54 MG PO CR tablet; Take 1 tablet (54 mg total) by mouth every morning.  Dispense: 30 tablet; Refill: 0 - buPROPion (WELLBUTRIN XL) 150 MG 24 hr tablet; Take 1 tablet (150 mg total) by mouth daily.  Dispense: 30 tablet; Refill: 5  2. Chronic left shoulder pain - naproxen (NAPROSYN) 500 MG tablet; Take 1 tablet (500 mg total) by mouth 2 (two) times daily with a meal.  Dispense: 60 tablet; Refill: 1 - Ambulatory referral to Orthopedic Surgery  3. Acute bursitis of left shoulder - naproxen (NAPROSYN) 500 MG tablet; Take 1 tablet (500 mg total) by mouth 2 (two) times daily with a meal.  Dispense: 60 tablet; Refill: 1  4. Bilateral carpal tunnel syndrome - naproxen (NAPROSYN) 500 MG tablet; Take 1 tablet (500 mg total) by mouth 2 (two) times daily with a meal.  Dispense: 60 tablet; Refill: 1   Continue all other maintenance medications as listed above.  Follow up plan: Return in about 3 months (around 06/21/2017) for recheck.  Educational handout given for survey  Remus LofflerAngel S. Talonda Artist PA-C [facility] [address]  [place], [place] [id number] [phone]   03/21/2017, 8:52 PM

## 2017-04-11 DIAGNOSIS — M7542 Impingement syndrome of left shoulder: Secondary | ICD-10-CM | POA: Diagnosis not present

## 2017-05-13 DIAGNOSIS — M7542 Impingement syndrome of left shoulder: Secondary | ICD-10-CM | POA: Diagnosis not present

## 2017-06-22 ENCOUNTER — Ambulatory Visit (INDEPENDENT_AMBULATORY_CARE_PROVIDER_SITE_OTHER): Payer: BLUE CROSS/BLUE SHIELD | Admitting: Physician Assistant

## 2017-06-22 ENCOUNTER — Encounter: Payer: Self-pay | Admitting: Physician Assistant

## 2017-06-22 VITALS — BP 95/62 | HR 77 | Temp 97.5°F | Ht 61.0 in | Wt 198.6 lb

## 2017-06-22 DIAGNOSIS — J0100 Acute maxillary sinusitis, unspecified: Secondary | ICD-10-CM | POA: Diagnosis not present

## 2017-06-22 DIAGNOSIS — Z77011 Contact with and (suspected) exposure to lead: Secondary | ICD-10-CM | POA: Diagnosis not present

## 2017-06-22 DIAGNOSIS — F902 Attention-deficit hyperactivity disorder, combined type: Secondary | ICD-10-CM

## 2017-06-22 MED ORDER — METHYLPHENIDATE HCL ER (OSM) 54 MG PO TBCR: 54.0000 mg | EXTENDED_RELEASE_TABLET | ORAL | 0 refills | Status: DC

## 2017-06-22 MED ORDER — AMOXICILLIN 500 MG PO CAPS: 1000.0000 mg | ORAL_CAPSULE | Freq: Two times a day (BID) | ORAL | 1 refills | Status: DC

## 2017-06-22 NOTE — Patient Instructions (Signed)
In a few days you may receive a survey in the mail or online from [employer] regarding your visit with us today. Please take a moment to fill this out. Your feedback is very important to our whole office. It can help us better understand your needs as well as improve your experience and satisfaction. Thank you for taking your time to complete it. We care about you.  Jaquise Faux, PA-C  

## 2017-06-22 NOTE — Progress Notes (Addendum)
BP 95/62   Pulse 77   Temp (!) 97.5 F (36.4 C) (Oral)   Ht  (1.549 m)   Wt 198 lb 9.6 oz (90.1 kg)   BMI 37.53 kg/m    Subjective:    Patient ID: Erica Galloway, female    DOB: April 07, 1971, 46 y.o.   MRN: [id number]  HPI: Erica Galloway is a 46 y.o. female presenting on 06/22/2017 for Follow-up and Nasal Congestion  This patient comes in for periodic recheck on medications and conditions including  ADHD and is with no issues. The patient also has significant lead exposure on her job. And is looking to have a clinical drawn. This patient has had many days of sinus headache and postnasal drainage. There is copious drainage at times. Denies any fever at this time. There has been a history of sinus infections in the past.  No history of sinus surgery. There is cough at night. It has become more prevalent in recent days..   All medications are reviewed today. There are no reports of any problems with the medications. All of the medical conditions are reviewed and updated.  Lab work is reviewed and will be ordered as medically necessary. There are no new problems reported with today's visit.   Relevant past medical, surgical, family and social history reviewed and updated as indicated. Allergies and medications reviewed and updated.  Past Medical History:  Diagnosis Date  . Allergy   . Arthritis   . Carpal tunnel syndrome, bilateral     Past Surgical History:  Procedure Laterality Date  . right ankle surgery      Review of Systems  Constitutional: Positive for chills and fatigue. Negative for activity change and appetite change.  HENT: Positive for congestion, postnasal drip, sinus pain, sinus pressure and sore throat.   Eyes: Negative.   Respiratory: Negative for cough and wheezing.   Cardiovascular: Negative.  Negative for chest pain, palpitations and leg swelling.  Gastrointestinal: Negative.   Genitourinary: Negative.   Musculoskeletal: Negative.   Skin: Negative.     Neurological: Positive for headaches.    Allergies as of 06/22/2017      Reactions   Sulfur       Medication List       Accurate as of 06/22/17  9:15 PM. Always use your most recent med list.          amoxicillin 500 MG capsule Commonly known as:  AMOXIL Take 2 capsules (1,000 mg total) by mouth 2 (two) times daily.   buPROPion 150 MG 24 hr tablet Commonly known as:  WELLBUTRIN XL Take 1 tablet (150 mg total) by mouth daily.   cetirizine 10 MG tablet Commonly known as:  ZYRTEC Take 10 mg by mouth daily.   fluticasone 50 MCG/ACT nasal spray Commonly known as:  FLONASE Place into both nostrils daily as needed for allergies or rhinitis.   methylphenidate 54 MG CR tablet Commonly known as:  CONCERTA Take 1 tablet (54 mg total) by mouth every morning.   methylphenidate 54 MG CR tablet Commonly known as:  CONCERTA Take 1 tablet (54 mg total) by mouth every morning.   methylphenidate 54 MG CR tablet Commonly known as:  CONCERTA Take 1 tablet (54 mg total) by mouth every morning.   vitamin C 1000 MG tablet Take 1,000 mg by mouth daily.            Discharge Care Instructions        Start  Ordered   06/22/17 0000  methylphenidate 54 MG PO CR tablet  BH-each morning    Question:  Supervising Provider  Answer:  Kevin FentonBRADSHAW, SAMUEL L   06/22/17 1609   06/22/17 0000  methylphenidate 54 MG PO CR tablet  BH-each morning    Comments:  Fill 30 days from original script date  Question:  Supervising Provider  Answer:  Elenora GammaBRADSHAW, SAMUEL L   06/22/17 1609   06/22/17 0000  methylphenidate 54 MG PO CR tablet  BH-each morning    Comments:  Fill 60 days from original script date  Question:  Supervising Provider  Answer:  Elenora GammaBRADSHAW, SAMUEL L   06/22/17 1609   06/22/17 0000  amoxicillin (AMOXIL) 500 MG capsule  2 times daily    Question:  Supervising Provider  Answer:  Elenora GammaBRADSHAW, SAMUEL L   06/22/17 1610   06/22/17 0000  Lead, blood    Question[place]  [place]   06/22/17 1620         Objective:    BP 95/62   Pulse 77   Temp (!) 97.5 F (36.4 C) (Oral)   Ht 5\' 1"  (1.549 m)   Wt 198 lb 9.6 oz (90.1 kg)   BMI 37.53 kg/m   Allergies  Allergen Reactions  . Sulfur     Physical Exam  Constitutional: She is oriented to person, place, and time. She appears well-developed and well-nourished.  HENT:  Head: Normocephalic and atraumatic.  Right Ear: Tympanic membrane and external ear normal. No middle ear effusion.  Left Ear: Tympanic membrane and external ear normal.  No middle ear effusion.  Nose: Mucosal edema and rhinorrhea present. Right sinus exhibits no maxillary sinus tenderness. Left sinus exhibits no maxillary sinus tenderness.  Mouth/Throat: Uvula is midline. Posterior oropharyngeal erythema present.  Eyes: Pupils are equal, round, and reactive to light. Conjunctivae and EOM are normal. Right eye exhibits no discharge. Left eye exhibits no discharge.  Neck: Normal range of motion.  Cardiovascular: Normal rate, regular rhythm and normal heart sounds.   Pulmonary/Chest: Effort normal and breath sounds normal. No respiratory distress. She has no wheezes.  Abdominal: Soft.  Lymphadenopathy:    She has no cervical adenopathy.  Neurological: She is alert and oriented to person, place, and time.  Skin: Skin is warm and dry.  Psychiatric: She has a normal mood and affect.    Results for orders placed or performed in visit on 11/08/16  WET PREP FOR TRICH, YEAST, CLUE  Result Value Ref Range   Trichomonas Exam Negative Negative   Yeast Exam Negative Negative   Clue Cell Exam Positive (A) Negative      Assessment & Plan:   1. Attention deficit hyperactivity disorder (ADHD), combined type - methylphenidate 54 MG PO CR tablet; Take 1 tablet (54 mg total) by mouth every morning.  Dispense: 30 tablet; Refill: 0 - methylphenidate 54 MG PO CR tablet; Take 1 tablet (54 mg total) by mouth every morning.  Dispense: 30 tablet;  Refill: 0 - methylphenidate 54 MG PO CR tablet; Take 1 tablet (54 mg total) by mouth every morning.  Dispense: 30 tablet; Refill: 0  2. Lead exposure risk assessment, high risk - Lead, blood  3. Acute non-recurrent maxillary sinusitis - amoxicillin (AMOXIL) 500 MG capsule; Take 2 capsules (1,000 mg total) by mouth 2 (two) times daily.  Dispense: 40 capsule; Refill: 1    Current Outpatient Prescriptions:  .  Ascorbic Acid (VITAMIN C) 1000 MG tablet, Take  1,000 mg by mouth daily., Disp: , Rfl:  .  buPROPion (WELLBUTRIN XL) 150 MG 24 hr tablet, Take 1 tablet (150 mg total) by mouth daily., Disp: 30 tablet, Rfl: 5 .  cetirizine (ZYRTEC) 10 MG tablet, Take 10 mg by mouth daily., Disp: , Rfl:  .  fluticasone (FLONASE) 50 MCG/ACT nasal spray, Place into both nostrils daily as needed for allergies or rhinitis., Disp: , Rfl:  .  methylphenidate 54 MG PO CR tablet, Take 1 tablet (54 mg total) by mouth every morning., Disp: 30 tablet, Rfl: 0 .  methylphenidate 54 MG PO CR tablet, Take 1 tablet (54 mg total) by mouth every morning., Disp: 30 tablet, Rfl: 0 .  methylphenidate 54 MG PO CR tablet, Take 1 tablet (54 mg total) by mouth every morning., Disp: 30 tablet, Rfl: 0 .  amoxicillin (AMOXIL) 500 MG capsule, Take 2 capsules (1,000 mg total) by mouth 2 (two) times daily., Disp: 40 capsule, Rfl: 1 Continue all other maintenance medications as listed above.  Follow up plan: Return in about 3 months (around 09/21/2017) for recheck.  Educational handout given for survey  Remus Loffler PA-C [facility] [address]  [place], [place] [address] [phone]   06/22/2017, 9:15 PM

## 2017-06-23 LAB — LEAD, BLOOD (ADULT >= 16 YRS): Lead-Whole Blood: 2 ug/dL (ref 0–4)

## 2017-07-22 ENCOUNTER — Other Ambulatory Visit: Payer: Self-pay | Admitting: Physician Assistant

## 2017-07-22 DIAGNOSIS — F902 Attention-deficit hyperactivity disorder, combined type: Secondary | ICD-10-CM

## 2017-07-22 MED ORDER — METHYLPHENIDATE HCL ER (OSM) 54 MG PO TBCR: 54.0000 mg | EXTENDED_RELEASE_TABLET | ORAL | 0 refills | Status: DC

## 2017-09-07 ENCOUNTER — Telehealth: Payer: Self-pay | Admitting: Physician Assistant

## 2017-09-07 NOTE — Telephone Encounter (Signed)
Patient will call back for new appointment.

## 2017-09-13 ENCOUNTER — Ambulatory Visit: Payer: BLUE CROSS/BLUE SHIELD | Admitting: Physician Assistant

## 2017-09-13 ENCOUNTER — Encounter: Payer: Self-pay | Admitting: Physician Assistant

## 2017-09-13 VITALS — BP 113/76 | HR 84 | Temp 97.4°F | Ht 61.0 in | Wt 216.4 lb

## 2017-09-13 DIAGNOSIS — N924 Excessive bleeding in the premenopausal period: Secondary | ICD-10-CM | POA: Diagnosis not present

## 2017-09-13 NOTE — Patient Instructions (Signed)
Menopause Menopause is the normal time of life when menstrual periods stop completely. Menopause is complete when you have missed 12 consecutive menstrual periods. It usually occurs between the ages of 48 years and 55 years. Very rarely does a woman develop menopause before the age of 40 years. At menopause, your ovaries stop producing the female hormones estrogen and progesterone. This can cause undesirable symptoms and also affect your health. Sometimes the symptoms may occur 4-5 years before the menopause begins. There is no relationship between menopause and:  Oral contraceptives.  Number of children you had.  Race.  The age your menstrual periods started (menarche).  Heavy smokers and very thin women may develop menopause earlier in life. What are the causes?  The ovaries stop producing the female hormones estrogen and progesterone. Other causes include:  Surgery to remove both ovaries.  The ovaries stop functioning for no known reason.  Tumors of the pituitary gland in the brain.  Medical disease that affects the ovaries and hormone production.  Radiation treatment to the abdomen or pelvis.  Chemotherapy that affects the ovaries.  What are the signs or symptoms?  Hot flashes.  Night sweats.  Decrease in sex drive.  Vaginal dryness and thinning of the vagina causing painful intercourse.  Dryness of the skin and developing wrinkles.  Headaches.  Tiredness.  Irritability.  Memory problems.  Weight gain.  Bladder infections.  Hair growth of the face and chest.  Infertility. More serious symptoms include:  Loss of bone (osteoporosis) causing breaks (fractures).  Depression.  Hardening and narrowing of the arteries (atherosclerosis) causing heart attacks and strokes.  How is this diagnosed?  When the menstrual periods have stopped for 12 straight months.  Physical exam.  Hormone studies of the blood. How is this treated? There are many treatment  choices and nearly as many questions about them. The decisions to treat or not to treat menopausal changes is an individual choice made with your health care provider. Your health care provider can discuss the treatments with you. Together, you can decide which treatment will work best for you. Your treatment choices may include:  Hormone therapy (estrogen and progesterone).  Non-hormonal medicines.  Treating the individual symptoms with medicine (for example antidepressants for depression).  Herbal medicines that may help specific symptoms.  Counseling by a psychiatrist or psychologist.  Group therapy.  Lifestyle changes including: ? Eating healthy. ? Regular exercise. ? Limiting caffeine and alcohol. ? Stress management and meditation.  No treatment.  Follow these instructions at home:  Take the medicine your health care provider gives you as directed.  Get plenty of sleep and rest.  Exercise regularly.  Eat a diet that contains calcium (good for the bones) and soy products (acts like estrogen hormone).  Avoid alcoholic beverages.  Do not smoke.  If you have hot flashes, dress in layers.  Take supplements, calcium, and vitamin D to strengthen bones.  You can use over-the-counter lubricants or moisturizers for vaginal dryness.  Group therapy is sometimes very helpful.  Acupuncture may be helpful in some cases. Contact a health care provider if:  You are not sure you are in menopause.  You are having menopausal symptoms and need advice and treatment.  You are still having menstrual periods after age 55 years.  You have pain with intercourse.  Menopause is complete (no menstrual period for 12 months) and you develop vaginal bleeding.  You need a referral to a specialist (gynecologist, psychiatrist, or psychologist) for treatment. Get help right   away if:  You have severe depression.  You have excessive vaginal bleeding.  You fell and think you have a  broken bone.  You have pain when you urinate.  You develop leg or chest pain.  You have a fast pounding heart beat (palpitations).  You have severe headaches.  You develop vision problems.  You feel a lump in your breast.  You have abdominal pain or severe indigestion. This information is not intended to replace advice given to you by your health care provider. Make sure you discuss any questions you have with your health care provider. Document Released: 12/18/2003 Document Revised: 03/04/2016 Document Reviewed: 04/26/2013 Elsevier Interactive Patient Education  2017 Elsevier Inc.  

## 2017-09-14 ENCOUNTER — Other Ambulatory Visit: Payer: Self-pay | Admitting: Physician Assistant

## 2017-09-14 DIAGNOSIS — F902 Attention-deficit hyperactivity disorder, combined type: Secondary | ICD-10-CM

## 2017-09-14 LAB — CBC WITH DIFFERENTIAL/PLATELET
BASOS: 0 %
Basophils Absolute: 0 10*3/uL (ref 0.0–0.2)
EOS (ABSOLUTE): 0.1 10*3/uL (ref 0.0–0.4)
EOS: 2 %
HEMATOCRIT: 38.6 % (ref 34.0–46.6)
Hemoglobin: 12.7 g/dL (ref 11.1–15.9)
Immature Grans (Abs): 0 10*3/uL (ref 0.0–0.1)
Immature Granulocytes: 0 %
LYMPHS ABS: 1.7 10*3/uL (ref 0.7–3.1)
Lymphs: 25 %
MCH: 29.8 pg (ref 26.6–33.0)
MCHC: 32.9 g/dL (ref 31.5–35.7)
MCV: 91 fL (ref 79–97)
MONOS ABS: 0.7 10*3/uL (ref 0.1–0.9)
Monocytes: 10 %
Neutrophils Absolute: 4.3 10*3/uL (ref 1.4–7.0)
Neutrophils: 63 %
PLATELETS: 294 10*3/uL (ref 150–379)
RBC: 4.26 x10E6/uL (ref 3.77–5.28)
RDW: 12.6 % (ref 12.3–15.4)
WBC: 6.8 10*3/uL (ref 3.4–10.8)

## 2017-09-14 LAB — FOLLICLE STIMULATING HORMONE: FSH: 48.4 m[IU]/mL

## 2017-09-14 MED ORDER — MEDROXYPROGESTERONE ACETATE 10 MG PO TABS: 10.0000 mg | ORAL_TABLET | Freq: Every day | ORAL | 1 refills | Status: DC

## 2017-09-14 NOTE — Progress Notes (Signed)
BP 113/76   Pulse 84   Temp (!) 97.4 F (36.3 C) (Oral)   Ht 5\' 1"  (1.549 m)   Wt 216 lb 6.4 oz (98.2 kg)   BMI 40.89 kg/m    Subjective:    Patient ID: Erica HopesKathy F Roh, female    DOB: 12/28/1970, 46 y.o.   MRN: [id number]  HPI: Erica Galloway is a 46 y.o. female presenting on 09/13/2017 for abnormal period This patient comes in today for abnormality of her menstrual cycle.  She is gone 10 months with no cycle.  However on November 23 she started having a cycle with significant amount of flow having to wear 2 pads at times.  And it has only stopped now.  She has not tried any medications for her.  She does not know what her family members did improve the menopausal changes.  Once in the past she has had several months where she would like that but at that time she restarted.  It was still in the normal schedule.  We discussed checking lab work today to check her menopausal status.  Relevant past medical, surgical, family and social history reviewed and updated as indicated. Allergies and medications reviewed and updated.  Past Medical History:  Diagnosis Date  . Allergy   . Arthritis   . Carpal tunnel syndrome, bilateral     Past Surgical History:  Procedure Laterality Date  . right ankle surgery      Review of Systems  Constitutional: Negative.  Negative for activity change, fatigue and fever.  HENT: Negative.   Eyes: Negative.   Respiratory: Negative.  Negative for cough.   Cardiovascular: Negative.  Negative for chest pain.  Gastrointestinal: Negative.  Negative for abdominal pain.  Endocrine: Negative.   Genitourinary: Positive for menstrual problem. Negative for dysuria.  Musculoskeletal: Negative.   Skin: Negative.   Neurological: Negative.     Allergies as of 09/13/2017      Reactions   Sulfur       Medication List        Accurate as of 09/13/17 11:59 PM. Always use your most recent med list.          buPROPion 150 MG 24 hr tablet Commonly known as:   WELLBUTRIN XL Take 1 tablet (150 mg total) by mouth daily.   cetirizine 10 MG tablet Commonly known as:  ZYRTEC Take 10 mg by mouth daily.   fluticasone 50 MCG/ACT nasal spray Commonly known as:  FLONASE Place into both nostrils daily as needed for allergies or rhinitis.   methylphenidate 54 MG CR tablet Commonly known as:  CONCERTA Take 1 tablet (54 mg total) by mouth every morning.   methylphenidate 54 MG CR tablet Commonly known as:  CONCERTA Take 1 tablet (54 mg total) by mouth every morning.   methylphenidate 54 MG CR tablet Commonly known as:  CONCERTA Take 1 tablet (54 mg total) by mouth every morning.   vitamin C 1000 MG tablet Take 1,000 mg by mouth daily.          Objective:    BP 113/76   Pulse 84   Temp (!) 97.4 F (36.3 C) (Oral)   Ht 5\' 1"  (1.549 m)   Wt 216 lb 6.4 oz (98.2 kg)   BMI 40.89 kg/m   Allergies  Allergen Reactions  . Sulfur     Physical Exam  Constitutional: She is oriented to person, place, and time. She appears well-developed and well-nourished.  HENT:  Head:  Normocephalic and atraumatic.  Eyes: Conjunctivae and EOM are normal. Pupils are equal, round, and reactive to light.  Cardiovascular: Normal rate, regular rhythm, normal heart sounds and intact distal pulses.  Pulmonary/Chest: Effort normal and breath sounds normal.  Abdominal: Soft. Bowel sounds are normal.  Neurological: She is alert and oriented to person, place, and time. She has normal reflexes.  Skin: Skin is warm and dry. No rash noted.  Psychiatric: She has a normal mood and affect. Her behavior is normal. Judgment and thought content normal.  Nursing note and vitals reviewed.   Results for orders placed or performed in visit on 09/13/17  CBC with Differential/Platelet  Result Value Ref Range   WBC 6.8 3.4 - 10.8 x10E3/uL   RBC 4.26 3.77 - 5.28 x10E6/uL   Hemoglobin 12.7 11.1 - 15.9 g/dL   Hematocrit 11.938.6 14.734.0 - 46.6 %   MCV 91 79 - 97 fL   MCH 29.8 26.6 -  33.0 pg   MCHC 32.9 31.5 - 35.7 g/dL   RDW 82.912.6 56.212.3 - 13.015.4 %   Platelets 294 150 - 379 x10E3/uL   Neutrophils 63 Not Estab. %   Lymphs 25 Not Estab. %   Monocytes 10 Not Estab. %   Eos 2 Not Estab. %   Basos 0 Not Estab. %   Neutrophils Absolute 4.3 1.4 - 7.0 x10E3/uL   Lymphocytes Absolute 1.7 0.7 - 3.1 x10E3/uL   Monocytes Absolute 0.7 0.1 - 0.9 x10E3/uL   EOS (ABSOLUTE) 0.1 0.0 - 0.4 x10E3/uL   Basophils Absolute 0.0 0.0 - 0.2 x10E3/uL   Immature Granulocytes 0 Not Estab. %   Immature Grans (Abs) 0.0 0.0 - 0.1 x10E3/uL  Follicle Stimulating Hormone  Result Value Ref Range   FSH 48.4 mIU/mL      Assessment & Plan:   1. Menopausal bleeding - CBC with Differential/Platelet - Follicle Stimulating Hormone    Current Outpatient Medications:  .  Ascorbic Acid (VITAMIN C) 1000 MG tablet, Take 1,000 mg by mouth daily., Disp: , Rfl:  .  buPROPion (WELLBUTRIN XL) 150 MG 24 hr tablet, Take 1 tablet (150 mg total) by mouth daily., Disp: 30 tablet, Rfl: 5 .  cetirizine (ZYRTEC) 10 MG tablet, Take 10 mg by mouth daily., Disp: , Rfl:  .  fluticasone (FLONASE) 50 MCG/ACT nasal spray, Place into both nostrils daily as needed for allergies or rhinitis., Disp: , Rfl:  .  methylphenidate 54 MG PO CR tablet, Take 1 tablet (54 mg total) by mouth every morning., Disp: 30 tablet, Rfl: 0 .  methylphenidate 54 MG PO CR tablet, Take 1 tablet (54 mg total) by mouth every morning., Disp: 30 tablet, Rfl: 0 .  methylphenidate 54 MG PO CR tablet, Take 1 tablet (54 mg total) by mouth every morning., Disp: 30 tablet, Rfl: 0 .  medroxyPROGESTERone (PROVERA) 10 MG tablet, Take 1 tablet (10 mg total) by mouth daily., Disp: 10 tablet, Rfl: 1 Continue all other maintenance medications as listed above.  Follow up plan: Return if symptoms worsen or fail to improve.  Educational handout given for survey  Remus LofflerAngel S. Altair Appenzeller PA-C [facility] [address]  [place], [place]  [id number] [phone]   09/14/2017, 10:24 AM

## 2017-09-21 ENCOUNTER — Encounter: Payer: Self-pay | Admitting: Physician Assistant

## 2017-09-21 ENCOUNTER — Ambulatory Visit: Payer: BLUE CROSS/BLUE SHIELD | Admitting: Physician Assistant

## 2017-09-21 DIAGNOSIS — F902 Attention-deficit hyperactivity disorder, combined type: Secondary | ICD-10-CM | POA: Diagnosis not present

## 2017-09-21 MED ORDER — METHYLPHENIDATE HCL ER (OSM) 54 MG PO TBCR: 54.0000 mg | EXTENDED_RELEASE_TABLET | ORAL | 0 refills | Status: DC

## 2017-09-21 MED ORDER — BUPROPION HCL ER (XL) 300 MG PO TB24: 300.0000 mg | ORAL_TABLET | Freq: Every day | ORAL | 5 refills | Status: DC

## 2017-09-21 MED ORDER — METHYLPHENIDATE HCL ER (OSM) 54 MG PO TBCR
54.0000 mg | EXTENDED_RELEASE_TABLET | ORAL | 0 refills | Status: DC
Start: 2017-09-21 — End: 2017-12-07

## 2017-09-21 NOTE — Patient Instructions (Signed)
In a few days you may receive a survey in the mail or online from [employer] regarding your visit with us today. Please take a moment to fill this out. Your feedback is very important to our whole office. It can help us better understand your needs as well as improve your experience and satisfaction. Thank you for taking your time to complete it. We care about you.  Mannie Ohlin, PA-C  

## 2017-09-23 NOTE — Progress Notes (Signed)
BP 95/63   Pulse 86   Ht 5\' 1"  (1.549 m)   Wt 215 lb (97.5 kg)   BMI 40.62 kg/m    Subjective:    Patient ID: Erica Galloway, female    DOB: November 19, 1970, 46 y.o.   MRN: [id number]  HPI: Erica Galloway is a 46 y.o. female presenting on 09/21/2017 for Follow-up (3 month )  This patient comes in for periodic recheck on medications and conditions including ADHD.  Overall she is doing well with her medications.  She can tell a difference with the Wellbutrin.  She thinks that it could be increased.  We are going to refill her medications today..   All medications are reviewed today. There are no reports of any problems with the medications. All of the medical conditions are reviewed and updated.  Lab work is reviewed and will be ordered as medically necessary. There are no new problems reported with today's visit.   Relevant past medical, surgical, family and social history reviewed and updated as indicated. Allergies and medications reviewed and updated.  Past Medical History:  Diagnosis Date  . Allergy   . Arthritis   . Carpal tunnel syndrome, bilateral     Past Surgical History:  Procedure Laterality Date  . right ankle surgery      Review of Systems  Constitutional: Negative.  Negative for activity change, fatigue and fever.  HENT: Negative.   Eyes: Negative.   Respiratory: Negative.  Negative for cough.   Cardiovascular: Negative.  Negative for chest pain.  Gastrointestinal: Negative.  Negative for abdominal pain.  Endocrine: Negative.   Genitourinary: Negative.  Negative for dysuria.  Musculoskeletal: Negative.   Skin: Negative.   Neurological: Negative.     Allergies as of 09/21/2017      Reactions   Sulfur       Medication List        Accurate as of 09/21/17 11:59 PM. Always use your most recent med list.          buPROPion 300 MG 24 hr tablet Commonly known as:  WELLBUTRIN XL Take 1 tablet (300 mg total) by mouth daily.   cetirizine 10 MG  tablet Commonly known as:  ZYRTEC Take 10 mg by mouth daily.   fluticasone 50 MCG/ACT nasal spray Commonly known as:  FLONASE Place into both nostrils daily as needed for allergies or rhinitis.   medroxyPROGESTERone 10 MG tablet Commonly known as:  PROVERA Take 1 tablet (10 mg total) by mouth daily.   methylphenidate 54 MG CR tablet Commonly known as:  CONCERTA Take 1 tablet (54 mg total) by mouth every morning.   methylphenidate 54 MG CR tablet Commonly known as:  CONCERTA Take 1 tablet (54 mg total) by mouth every morning.   methylphenidate 54 MG CR tablet Commonly known as:  CONCERTA Take 1 tablet (54 mg total) by mouth every morning.   vitamin C 1000 MG tablet Take 1,000 mg by mouth daily.          Objective:    BP 95/63   Pulse 86   Ht 5\' 1"  (1.549 m)   Wt 215 lb (97.5 kg)   BMI 40.62 kg/m   Allergies  Allergen Reactions  . Sulfur     Physical Exam  Constitutional: She is oriented to person, place, and time. She appears well-developed and well-nourished.  HENT:  Head: Normocephalic and atraumatic.  Eyes: Conjunctivae and EOM are normal. Pupils are equal, round, and reactive to light.  Cardiovascular: Normal rate, regular rhythm, normal heart sounds and intact distal pulses.  Pulmonary/Chest: Effort normal and breath sounds normal.  Abdominal: Soft. Bowel sounds are normal.  Neurological: She is alert and oriented to person, place, and time. She has normal reflexes.  Skin: Skin is warm and dry. No rash noted.  Psychiatric: She has a normal mood and affect. Her behavior is normal. Judgment and thought content normal.    Results for orders placed or performed in visit on 09/13/17  CBC with Differential/Platelet  Result Value Ref Range   WBC 6.8 3.4 - 10.8 x10E3/uL   RBC 4.26 3.77 - 5.28 x10E6/uL   Hemoglobin 12.7 11.1 - 15.9 g/dL   Hematocrit 91.4 78.2 - 46.6 %   MCV 91 79 - 97 fL   MCH 29.8 26.6 - 33.0 pg   MCHC 32.9 31.5 - 35.7 g/dL   RDW 95.6  21.3 - 08.6 %   Platelets 294 150 - 379 x10E3/uL   Neutrophils 63 Not Estab. %   Lymphs 25 Not Estab. %   Monocytes 10 Not Estab. %   Eos 2 Not Estab. %   Basos 0 Not Estab. %   Neutrophils Absolute 4.3 1.4 - 7.0 x10E3/uL   Lymphocytes Absolute 1.7 0.7 - 3.1 x10E3/uL   Monocytes Absolute 0.7 0.1 - 0.9 x10E3/uL   EOS (ABSOLUTE) 0.1 0.0 - 0.4 x10E3/uL   Basophils Absolute 0.0 0.0 - 0.2 x10E3/uL   Immature Granulocytes 0 Not Estab. %   Immature Grans (Abs) 0.0 0.0 - 0.1 x10E3/uL  Follicle Stimulating Hormone  Result Value Ref Range   FSH 48.4 mIU/mL      Assessment & Plan:   1. Attention deficit hyperactivity disorder (ADHD), combined type - methylphenidate 54 MG PO CR tablet; Take 1 tablet (54 mg total) by mouth every morning.  Dispense: 30 tablet; Refill: 0 - methylphenidate 54 MG PO CR tablet; Take 1 tablet (54 mg total) by mouth every morning.  Dispense: 30 tablet; Refill: 0 - methylphenidate 54 MG PO CR tablet; Take 1 tablet (54 mg total) by mouth every morning.  Dispense: 30 tablet; Refill: 0 - buPROPion (WELLBUTRIN XL) 300 MG 24 hr tablet; Take 1 tablet (300 mg total) by mouth daily.  Dispense: 30 tablet; Refill: 5    Current Outpatient Medications:  .  Ascorbic Acid (VITAMIN C) 1000 MG tablet, Take 1,000 mg by mouth daily., Disp: , Rfl:  .  buPROPion (WELLBUTRIN XL) 300 MG 24 hr tablet, Take 1 tablet (300 mg total) by mouth daily., Disp: 30 tablet, Rfl: 5 .  cetirizine (ZYRTEC) 10 MG tablet, Take 10 mg by mouth daily., Disp: , Rfl:  .  fluticasone (FLONASE) 50 MCG/ACT nasal spray, Place into both nostrils daily as needed for allergies or rhinitis., Disp: , Rfl:  .  medroxyPROGESTERone (PROVERA) 10 MG tablet, Take 1 tablet (10 mg total) by mouth daily., Disp: 10 tablet, Rfl: 1 .  methylphenidate 54 MG PO CR tablet, Take 1 tablet (54 mg total) by mouth every morning., Disp: 30 tablet, Rfl: 0 .  methylphenidate 54 MG PO CR tablet, Take 1 tablet (54 mg total) by mouth every  morning., Disp: 30 tablet, Rfl: 0 .  methylphenidate 54 MG PO CR tablet, Take 1 tablet (54 mg total) by mouth every morning., Disp: 30 tablet, Rfl: 0 Continue all other maintenance medications as listed above.  Follow up plan: Return in about 3 months (around 12/20/2017) for recheck.  Educational handout given for [employer].  Darla LeschesJones PA-C [facility] [address]  [place], [place] [id number] [phone]   09/23/2017, 8:17 AM

## 2017-11-08 ENCOUNTER — Encounter: Payer: Self-pay | Admitting: Physician Assistant

## 2017-11-08 ENCOUNTER — Ambulatory Visit: Payer: BLUE CROSS/BLUE SHIELD | Admitting: Physician Assistant

## 2017-11-08 VITALS — BP 119/82 | HR 80 | Temp 97.0°F | Ht 61.0 in | Wt 214.2 lb

## 2017-11-08 DIAGNOSIS — K30 Functional dyspepsia: Secondary | ICD-10-CM | POA: Diagnosis not present

## 2017-11-08 DIAGNOSIS — Z0000 Encounter for general adult medical examination without abnormal findings: Secondary | ICD-10-CM | POA: Diagnosis not present

## 2017-11-08 DIAGNOSIS — K3184 Gastroparesis: Secondary | ICD-10-CM | POA: Diagnosis not present

## 2017-11-08 DIAGNOSIS — R112 Nausea with vomiting, unspecified: Secondary | ICD-10-CM | POA: Diagnosis not present

## 2017-11-08 MED ORDER — OMEPRAZOLE 20 MG PO CPDR: 20.0000 mg | DELAYED_RELEASE_CAPSULE | Freq: Every day | ORAL | 3 refills | Status: DC

## 2017-11-08 NOTE — Patient Instructions (Signed)
Breakfast: eggs 2-3 Or greek yogurt low fat DANNON 1 slice delightful [employer] bread  Lunch: 2 slice [employer] delightfully bread       Or Nature's Own Light 4 ounces chicken, turkey, roast beef 1 slice Thin sliced cheese [employer] Mustard ok 1 piece of fruit  Supper: 6 ounces lean meat 2 cups raw/cooked veg or 1 cup pintos, corn lima  3 snacks 100 calories or less  

## 2017-11-09 DIAGNOSIS — K30 Functional dyspepsia: Secondary | ICD-10-CM | POA: Insufficient documentation

## 2017-11-09 DIAGNOSIS — K3184 Gastroparesis: Secondary | ICD-10-CM | POA: Insufficient documentation

## 2017-11-09 LAB — CMP14+EGFR
ALK PHOS: 84 IU/L (ref 39–117)
ALT: 13 IU/L (ref 0–32)
AST: 16 IU/L (ref 0–40)
Albumin/Globulin Ratio: 1.6 (ref 1.2–2.2)
Albumin: 4.4 g/dL (ref 3.5–5.5)
BILIRUBIN TOTAL: 0.5 mg/dL (ref 0.0–1.2)
BUN / CREAT RATIO: 34 — AB (ref 9–23)
BUN: 18 mg/dL (ref 6–24)
CHLORIDE: 104 mmol/L (ref 96–106)
CO2: 21 mmol/L (ref 20–29)
Calcium: 9.6 mg/dL (ref 8.7–10.2)
Creatinine, Ser: 0.53 mg/dL — ABNORMAL LOW (ref 0.57–1.00)
GFR calc non Af Amer: 113 mL/min/{1.73_m2} (ref >59)
GFR, EST AFRICAN AMERICAN: 130 mL/min/{1.73_m2} (ref >59)
GLOBULIN, TOTAL: 2.7 g/dL (ref 1.5–4.5)
Glucose: 73 mg/dL (ref 65–99)
POTASSIUM: 4.9 mmol/L (ref 3.5–5.2)
SODIUM: 141 mmol/L (ref 134–144)
TOTAL PROTEIN: 7.1 g/dL (ref 6.0–8.5)

## 2017-11-09 LAB — CBC WITH DIFFERENTIAL/PLATELET
BASOS: 1 %
Basophils Absolute: 0 10*3/uL (ref 0.0–0.2)
EOS (ABSOLUTE): 0.1 10*3/uL (ref 0.0–0.4)
Eos: 2 %
HEMOGLOBIN: 13 g/dL (ref 11.1–15.9)
Hematocrit: 39.2 % (ref 34.0–46.6)
IMMATURE GRANS (ABS): 0 10*3/uL (ref 0.0–0.1)
Immature Granulocytes: 0 %
LYMPHS ABS: 1.5 10*3/uL (ref 0.7–3.1)
LYMPHS: 22 %
MCH: 29.9 pg (ref 26.6–33.0)
MCHC: 33.2 g/dL (ref 31.5–35.7)
MCV: 90 fL (ref 79–97)
MONOCYTES: 12 %
Monocytes Absolute: 0.8 10*3/uL (ref 0.1–0.9)
NEUTROS ABS: 4.1 10*3/uL (ref 1.4–7.0)
Neutrophils: 63 %
Platelets: 317 10*3/uL (ref 150–379)
RBC: 4.35 x10E6/uL (ref 3.77–5.28)
RDW: 12.6 % (ref 12.3–15.4)
WBC: 6.5 10*3/uL (ref 3.4–10.8)

## 2017-11-09 LAB — LIPID PANEL
Chol/HDL Ratio: 2.2 ratio (ref 0.0–4.4)
Cholesterol, Total: 157 mg/dL (ref 100–199)
HDL: 71 mg/dL (ref >39)
LDL Calculated: 79 mg/dL (ref 0–99)
Triglycerides: 35 mg/dL (ref 0–149)
VLDL CHOLESTEROL CAL: 7 mg/dL (ref 5–40)

## 2017-11-09 LAB — THYROID PANEL WITH TSH
FREE THYROXINE INDEX: 1.9 (ref 1.2–4.9)
T3 Uptake Ratio: 27 % (ref 24–39)
T4 TOTAL: 6.9 ug/dL (ref 4.5–12.0)
TSH: 1.57 u[IU]/mL (ref 0.450–4.500)

## 2017-11-09 NOTE — Progress Notes (Signed)
BP 119/82   Pulse 80   Temp (!) 97 F (36.1 C) (Oral)   Ht _0  (1.549 m)   Wt 214 lb 3.2 oz (97.2 kg)   BMI 40.47 kg/m    Subjective:    Patient ID: Erica Galloway, female    DOB: 1970/11/20, 47 y.o.   MRN: [id number]  HPI: Erica Galloway is a 47 y.o. female presenting on 11/08/2017 for Emesis and Nausea  Patient reports that she did go to a gastroenterologist many years ago.  She did have an upper scope.  She was diagnosed with a slow emptying stomach.  There were in a call this gastroparesis.  She is currently having difficulty with a new diet she is on.  It is ketogenic.  She is eating high amount of fat and protein and very very low carbs.  Episode she has had have been in the early morning over the past 2 days.  She felt weak and nauseous.  She did vomit once.  She has not had continued vomiting.  She also has had normal bowel movements with no changes at all.  We have discussed the possibility of indigestion, her gastroparesis and the possibility of gallbladder issue.  We will have her adjust her diet and start him some medication and she is to call back if anything does not improve.  Relevant past medical, surgical, family and social history reviewed and updated as indicated. Allergies and medications reviewed and updated.  Past Medical History:  Diagnosis Date  . Allergy   . Arthritis   . Carpal tunnel syndrome, bilateral     Past Surgical History:  Procedure Laterality Date  . right ankle surgery      Review of Systems  Constitutional: Negative.  Negative for activity change, fatigue and fever.  HENT: Negative.   Eyes: Negative.   Respiratory: Negative.  Negative for cough.   Cardiovascular: Negative.  Negative for chest pain.  Gastrointestinal: Positive for abdominal distention, nausea and vomiting. Negative for abdominal pain, blood in stool, constipation and diarrhea.  Endocrine: Negative.   Genitourinary: Negative.  Negative for dysuria.  Musculoskeletal:  Negative.   Skin: Negative.   Neurological: Negative.     Allergies as of 11/08/2017      Reactions   Sulfur       Medication List        Accurate as of 11/08/17 11:59 PM. Always use your most recent med list.          buPROPion 300 MG 24 hr tablet Commonly known as:  WELLBUTRIN XL Take 1 tablet (300 mg total) by mouth daily.   cetirizine 10 MG tablet Commonly known as:  ZYRTEC Take 10 mg by mouth daily.   fluticasone 50 MCG/ACT nasal spray Commonly known as:  FLONASE Place into both nostrils daily as needed for allergies or rhinitis.   medroxyPROGESTERone 10 MG tablet Commonly known as:  PROVERA Take 1 tablet (10 mg total) by mouth daily.   methylphenidate 54 MG CR tablet Commonly known as:  CONCERTA Take 1 tablet (54 mg total) by mouth every morning.   methylphenidate 54 MG CR tablet Commonly known as:  CONCERTA Take 1 tablet (54 mg total) by mouth every morning.   methylphenidate 54 MG CR tablet Commonly known as:  CONCERTA Take 1 tablet (54 mg total) by mouth every morning.   omeprazole 20 MG capsule Commonly known as:  PRILOSEC Take 1 capsule (20 mg total) by mouth daily.   vitamin  C 1000 MG tablet Take 1,000 mg by mouth daily.          Objective:    BP 119/82   Pulse 80   Temp (!) 97 F (36.1 C) (Oral)   Ht _0  (1.549 m)   Wt 214 lb 3.2 oz (97.2 kg)   BMI 40.47 kg/m   Allergies  Allergen Reactions  . Sulfur     Physical Exam  Constitutional: She is oriented to person, place, and time. She appears well-developed and well-nourished.  HENT:  Head: Normocephalic and atraumatic.  Right Ear: Tympanic membrane, external ear and ear canal normal.  Left Ear: Tympanic membrane, external ear and ear canal normal.  Nose: Nose normal. No rhinorrhea.  Mouth/Throat: Oropharynx is clear and moist and mucous membranes are normal. No oropharyngeal exudate or posterior oropharyngeal erythema.  Eyes: Conjunctivae and EOM are normal. Pupils are equal,  round, and reactive to light.  Neck: Normal range of motion. Neck supple.  Cardiovascular: Normal rate, regular rhythm, normal heart sounds and intact distal pulses.  Pulmonary/Chest: Effort normal and breath sounds normal.  Abdominal: Soft. Bowel sounds are normal.  Neurological: She is alert and oriented to person, place, and time. She has normal reflexes.  Skin: Skin is warm and dry. No rash noted.  Psychiatric: She has a normal mood and affect. Her behavior is normal. Judgment and thought content normal.        Assessment & Plan:   1. Nausea and vomiting, intractability of vomiting not specified, unspecified vomiting type - CMP14+EGFR - Lipid panel - Thyroid Panel With TSH  2. Indigestion - omeprazole (PRILOSEC) 20 MG capsule; Take 1 capsule (20 mg total) by mouth daily.  Dispense: 30 capsule; Refill: 3 - CBC with Differential/Platelet - CMP14+EGFR - Lipid panel - Thyroid Panel With TSH  3. Well adult exam - CBC with Differential/Platelet - CMP14+EGFR - Lipid panel - Thyroid Panel With TSH  4. Gastroparesis    Current Outpatient Medications:  .  Ascorbic Acid (VITAMIN C) 1000 MG tablet, Take 1,000 mg by mouth daily., Disp: , Rfl:  .  buPROPion (WELLBUTRIN XL) 300 MG 24 hr tablet, Take 1 tablet (300 mg total) by mouth daily., Disp: 30 tablet, Rfl: 5 .  cetirizine (ZYRTEC) 10 MG tablet, Take 10 mg by mouth daily., Disp: , Rfl:  .  fluticasone (FLONASE) 50 MCG/ACT nasal spray, Place into both nostrils daily as needed for allergies or rhinitis., Disp: , Rfl:  .  medroxyPROGESTERone (PROVERA) 10 MG tablet, Take 1 tablet (10 mg total) by mouth daily., Disp: 10 tablet, Rfl: 1 .  methylphenidate 54 MG PO CR tablet, Take 1 tablet (54 mg total) by mouth every morning., Disp: 30 tablet, Rfl: 0 .  methylphenidate 54 MG PO CR tablet, Take 1 tablet (54 mg total) by mouth every morning., Disp: 30 tablet, Rfl: 0 .  methylphenidate 54 MG PO CR tablet, Take 1 tablet (54 mg total) by  mouth every morning., Disp: 30 tablet, Rfl: 0 .  omeprazole (PRILOSEC) 20 MG capsule, Take 1 capsule (20 mg total) by mouth daily., Disp: 30 capsule, Rfl: 3 Continue all other maintenance medications as listed above.  Follow up plan: Return if symptoms worsen or fail to improve.  Educational handout given for [place] PA-C [facility] [address]  [place],  [address] [phone]   11/09/2017, 10:55 AM

## 2017-12-07 ENCOUNTER — Ambulatory Visit: Payer: BLUE CROSS/BLUE SHIELD | Admitting: Physician Assistant

## 2017-12-07 ENCOUNTER — Encounter: Payer: Self-pay | Admitting: Physician Assistant

## 2017-12-07 VITALS — BP 115/71 | HR 70 | Temp 97.0°F | Ht 61.0 in | Wt 209.6 lb

## 2017-12-07 DIAGNOSIS — F902 Attention-deficit hyperactivity disorder, combined type: Secondary | ICD-10-CM

## 2017-12-07 DIAGNOSIS — B351 Tinea unguium: Secondary | ICD-10-CM | POA: Diagnosis not present

## 2017-12-07 MED ORDER — METHYLPHENIDATE HCL ER (OSM) 54 MG PO TBCR: 54.0000 mg | EXTENDED_RELEASE_TABLET | ORAL | 0 refills | Status: DC

## 2017-12-07 MED ORDER — TERBINAFINE HCL 250 MG PO TABS: 250.0000 mg | ORAL_TABLET | Freq: Every day | ORAL | 3 refills | Status: DC

## 2017-12-07 NOTE — Patient Instructions (Signed)
In a few days you may receive a survey in the mail or online from [employer] regarding your visit with us today. Please take a moment to fill this out. Your feedback is very important to our whole office. It can help us better understand your needs as well as improve your experience and satisfaction. Thank you for taking your time to complete it. We care about you.  Tyrice Hewitt, PA-C  

## 2017-12-08 NOTE — Progress Notes (Signed)
BP 115/71   Pulse 70   Temp (!) 97 F (36.1 C) (Oral)   Ht '5\' 1"'  (1.549 m)   Wt 209 lb 9.6 oz (95.1 kg)   BMI 39.60 kg/m    Subjective:    Patient ID: Erica Galloway, female    DOB: [birth year], 47 y.o.   MRN: [id number]  HPI: Erica Galloway is a 47 y.o. female presenting on 12/07/2017 for Follow-up (1 month )  Patient comes in for 75-monthrecheck on her ADHD medications.  She reports overall she is doing very well with them.  They are appropriately through the work day and is not completing tasks.  She also has thickened yellow toenail she has never taken medication for fungal nails.  Past Medical History:  Diagnosis Date  . Allergy   . Arthritis   . Carpal tunnel syndrome, bilateral    Relevant past medical, surgical, family and social history reviewed and updated as indicated. Interim medical history since our last visit reviewed. Allergies and medications reviewed and updated. DATA REVIEWED: CHART IN EPIC  Family History reviewed for pertinent findings.  Review of Systems  Constitutional: Negative.   HENT: Negative.   Eyes: Negative.   Respiratory: Negative.   Gastrointestinal: Negative.   Genitourinary: Negative.   Skin: Positive for rash.    Allergies as of 12/07/2017      Reactions   Sulfur       Medication List        Accurate as of 12/07/17 11:59 PM. Always use your most recent med list.          buPROPion 300 MG 24 hr tablet Commonly known as:  WELLBUTRIN XL Take 1 tablet (300 mg total) by mouth daily.   cetirizine 10 MG tablet Commonly known as:  ZYRTEC Take 10 mg by mouth daily.   fluticasone 50 MCG/ACT nasal spray Commonly known as:  FLONASE Place into both nostrils daily as needed for allergies or rhinitis.   medroxyPROGESTERone 10 MG tablet Commonly known as:  PROVERA Take 1 tablet (10 mg total) by mouth daily.   methylphenidate 54 MG CR tablet Commonly known as:  CONCERTA Take 1 tablet (54 mg total) by mouth every morning.     methylphenidate 54 MG CR tablet Commonly known as:  CONCERTA Take 1 tablet (54 mg total) by mouth every morning.   methylphenidate 54 MG CR tablet Commonly known as:  CONCERTA Take 1 tablet (54 mg total) by mouth every morning.   omeprazole 20 MG capsule Commonly known as:  PRILOSEC Take 1 capsule (20 mg total) by mouth daily.   terbinafine 250 MG tablet Commonly known as:  LAMISIL Take 1 tablet (250 mg total) by mouth daily.   vitamin C 1000 MG tablet Take 1,000 mg by mouth daily.          Objective:    BP 115/71   Pulse 70   Temp (!) 97 F (36.1 C) (Oral)   Ht '5\' 1"'  (1.549 m)   Wt 209 lb 9.6 oz (95.1 kg)   BMI 39.60 kg/m   Allergies  Allergen Reactions  . Sulfur     Wt Readings from Last 3 Encounters:  12/07/17 209 lb 9.6 oz (95.1 kg)  11/08/17 214 lb 3.2 oz (97.2 kg)  09/21/17 215 lb (97.5 kg)    Physical Exam  Constitutional: She is oriented to person, place, and time. She appears well-developed and well-nourished.  HENT:  Head: Normocephalic and atraumatic.  Eyes: Conjunctivae  and EOM are normal. Pupils are equal, round, and reactive to light.  Cardiovascular: Normal rate, regular rhythm, normal heart sounds and intact distal pulses.  Pulmonary/Chest: Effort normal and breath sounds normal.  Abdominal: Soft. Bowel sounds are normal.  Neurological: She is alert and oriented to person, place, and time. She has normal reflexes.  Skin: Skin is warm and dry. No rash noted.  Multiple nails thick and yellow from base to end. No cellulitis.  Psychiatric: She has a normal mood and affect. Her behavior is normal. Judgment and thought content normal.    Results for orders placed or performed in visit on 11/08/17  CBC with Differential/Platelet  Result Value Ref Range   WBC 6.5 3.4 - 10.8 x10E3/uL   RBC 4.35 3.77 - 5.28 x10E6/uL   Hemoglobin 13.0 11.1 - 15.9 g/dL   Hematocrit 39.2 34.0 - 46.6 %   MCV 90 79 - 97 fL   MCH 29.9 26.6 - 33.0 pg   MCHC 33.2 31.5  - 35.7 g/dL   RDW 12.6 12.3 - 15.4 %   Platelets 317 150 - 379 x10E3/uL   Neutrophils 63 Not Estab. %   Lymphs 22 Not Estab. %   Monocytes 12 Not Estab. %   Eos 2 Not Estab. %   Basos 1 Not Estab. %   Neutrophils Absolute 4.1 1.4 - 7.0 x10E3/uL   Lymphocytes Absolute 1.5 0.7 - 3.1 x10E3/uL   Monocytes Absolute 0.8 0.1 - 0.9 x10E3/uL   EOS (ABSOLUTE) 0.1 0.0 - 0.4 x10E3/uL   Basophils Absolute 0.0 0.0 - 0.2 x10E3/uL   Immature Granulocytes 0 Not Estab. %   Immature Grans (Abs) 0.0 0.0 - 0.1 x10E3/uL  CMP14+EGFR  Result Value Ref Range   Glucose 73 65 - 99 mg/dL   BUN 18 6 - 24 mg/dL   Creatinine, Ser 0.53 (L) 0.57 - 1.00 mg/dL   GFR calc non Af Amer 113 >59 mL/min/1.73   GFR calc Af Amer 130 >59 mL/min/1.73   BUN/Creatinine Ratio 34 (H) 9 - 23   Sodium 141 134 - 144 mmol/L   Potassium 4.9 3.5 - 5.2 mmol/L   Chloride 104 96 - 106 mmol/L   CO2 21 20 - 29 mmol/L   Calcium 9.6 8.7 - 10.2 mg/dL   Total Protein 7.1 6.0 - 8.5 g/dL   Albumin 4.4 3.5 - 5.5 g/dL   Globulin, Total 2.7 1.5 - 4.5 g/dL   Albumin/Globulin Ratio 1.6 1.2 - 2.2   Bilirubin Total 0.5 0.0 - 1.2 mg/dL   Alkaline Phosphatase 84 39 - 117 IU/L   AST 16 0 - 40 IU/L   ALT 13 0 - 32 IU/L  Lipid panel  Result Value Ref Range   Cholesterol, Total 157 100 - 199 mg/dL   Triglycerides 35 0 - 149 mg/dL   HDL 71 >39 mg/dL   VLDL Cholesterol Cal 7 5 - 40 mg/dL   LDL Calculated 79 0 - 99 mg/dL   Chol/HDL Ratio 2.2 0.0 - 4.4 ratio  Thyroid Panel With TSH  Result Value Ref Range   TSH 1.570 0.450 - 4.500 uIU/mL   T4, Total 6.9 4.5 - 12.0 ug/dL   T3 Uptake Ratio 27 24 - 39 %   Free Thyroxine Index 1.9 1.2 - 4.9      Assessment & Plan:   1. Attention deficit hyperactivity disorder (ADHD), combined type - methylphenidate 54 MG PO CR tablet; Take 1 tablet (54 mg total) by mouth every morning.  Dispense:  30 tablet; Refill: 0 - methylphenidate 54 MG PO CR tablet; Take 1 tablet (54 mg total) by mouth every morning.   Dispense: 30 tablet; Refill: 0 - methylphenidate 54 MG PO CR tablet; Take 1 tablet (54 mg total) by mouth every morning.  Dispense: 30 tablet; Refill: 0  2. Onychomycosis - terbinafine (LAMISIL) 250 MG tablet; Take 1 tablet (250 mg total) by mouth daily.  Dispense: 30 tablet; Refill: 3 Avoid etoh  Continue all other maintenance medications as listed above.  Follow up plan: Return in about 3 months (around 03/06/2018) for recheck.  Educational handout given for [place] PA-C [place] [address]  [place], [place] [address] [phone]   12/08/2017, 11:22 AM

## 2018-03-07 ENCOUNTER — Ambulatory Visit: Payer: BLUE CROSS/BLUE SHIELD | Admitting: Physician Assistant

## 2018-03-07 ENCOUNTER — Encounter: Payer: Self-pay | Admitting: Physician Assistant

## 2018-03-07 ENCOUNTER — Other Ambulatory Visit: Payer: Self-pay | Admitting: Physician Assistant

## 2018-03-07 VITALS — BP 104/61 | HR 88 | Temp 97.5°F | Ht 61.0 in | Wt 222.0 lb

## 2018-03-07 DIAGNOSIS — F902 Attention-deficit hyperactivity disorder, combined type: Secondary | ICD-10-CM | POA: Diagnosis not present

## 2018-03-07 DIAGNOSIS — R21 Rash and other nonspecific skin eruption: Secondary | ICD-10-CM | POA: Diagnosis not present

## 2018-03-07 DIAGNOSIS — B351 Tinea unguium: Secondary | ICD-10-CM | POA: Insufficient documentation

## 2018-03-07 MED ORDER — METHYLPHENIDATE HCL ER (OSM) 54 MG PO TBCR: 54.0000 mg | EXTENDED_RELEASE_TABLET | ORAL | 0 refills | Status: DC

## 2018-03-07 MED ORDER — TERBINAFINE HCL 250 MG PO TABS: 250.0000 mg | ORAL_TABLET | Freq: Every day | ORAL | 3 refills | Status: DC

## 2018-03-07 MED ORDER — CLOBETASOL PROPIONATE 0.05 % EX CREA: 1.0000 "application " | TOPICAL_CREAM | Freq: Two times a day (BID) | CUTANEOUS | 0 refills | Status: DC

## 2018-03-07 MED ORDER — BUPROPION HCL ER (XL) 300 MG PO TB24: 300.0000 mg | ORAL_TABLET | Freq: Every day | ORAL | 5 refills | Status: DC

## 2018-03-08 NOTE — Progress Notes (Signed)
BP 104/61   Pulse 88   Temp (!) 97.5 F (36.4 C) (Oral)   Ht  (1.549 m)   Wt 222 lb (100.7 kg)   BMI 41.95 kg/m    Subjective:    Patient ID: Erica Galloway, female    DOB: 03/29/1971, 47 y.o.   MRN: [id number]  HPI: Erica Galloway is a 47 y.o. female presenting on 03/07/2018 for ADHD (med refills); Nail Problem; and sun poison  This patient comes in for recheck on her ADHD medication.  She states she is doing very well with her medicines and having no difficulty.  She does need refills for the next 3 months.  She is having a recurrence of the nail fungus on her toes and does need refills on this and she is having some slight erythematous areas on the arms that are itching.  It was after she had had some sun exposure.  Past Medical History:  Diagnosis Date  . Allergy   . Arthritis   . Carpal tunnel syndrome, bilateral    Relevant past medical, surgical, family and social history reviewed and updated as indicated. Interim medical history since our last visit reviewed. Allergies and medications reviewed and updated. DATA REVIEWED: CHART IN EPIC  Family History reviewed for pertinent findings.  Review of Systems  Constitutional: Negative.   HENT: Negative.   Eyes: Negative.   Respiratory: Negative.   Gastrointestinal: Negative.   Genitourinary: Negative.   Skin: Positive for color change and rash.    Allergies as of 03/07/2018      Reactions   Sulfur       Medication List        Accurate as of 03/07/18 11:59 PM. Always use your most recent med list.          buPROPion 300 MG 24 hr tablet Commonly known as:  WELLBUTRIN XL Take 1 tablet (300 mg total) by mouth daily.   cetirizine 10 MG tablet Commonly known as:  ZYRTEC Take 10 mg by mouth daily.   clobetasol cream 0.05 % Commonly known as:  TEMOVATE Apply 1 application topically 2 (two) times daily.   fluticasone 50 MCG/ACT nasal spray Commonly known as:  FLONASE Place into both nostrils daily as  needed for allergies or rhinitis.   methylphenidate 54 MG CR tablet Commonly known as:  CONCERTA Take 1 tablet (54 mg total) by mouth every morning.   methylphenidate 54 MG CR tablet Commonly known as:  CONCERTA Take 1 tablet (54 mg total) by mouth every morning.   methylphenidate 54 MG CR tablet Commonly known as:  CONCERTA Take 1 tablet (54 mg total) by mouth every morning.   omeprazole 20 MG capsule Commonly known as:  PRILOSEC Take 1 capsule (20 mg total) by mouth daily.   terbinafine 250 MG tablet Commonly known as:  LAMISIL Take 1 tablet (250 mg total) by mouth daily.   vitamin C 1000 MG tablet Take 1,000 mg by mouth daily.          Objective:    BP 104/61   Pulse 88   Temp (!) 97.5 F (36.4 C) (Oral)   Ht  (1.549 m)   Wt 222 lb (100.7 kg)   BMI 41.95 kg/m   Allergies  Allergen Reactions  . Sulfur     Wt Readings from Last 3 Encounters:  03/07/18 222 lb (100.7 kg)  12/07/17 209 lb 9.6 oz (95.1 kg)  11/08/17 214 lb 3.2 oz (97.2 kg)  Physical Exam  Constitutional: She is oriented to person, place, and time. She appears well-developed and well-nourished.  HENT:  Head: Normocephalic and atraumatic.  Eyes: Pupils are equal, round, and reactive to light. Conjunctivae and EOM are normal.  Cardiovascular: Normal rate, regular rhythm, normal heart sounds and intact distal pulses.  Pulmonary/Chest: Effort normal and breath sounds normal.  Abdominal: Soft. Bowel sounds are normal.  Neurological: She is alert and oriented to person, place, and time. She has normal reflexes.  Skin: Skin is warm and dry. Lesion and rash noted. Rash is maculopapular. There is erythema.     Psychiatric: She has a normal mood and affect. Her behavior is normal. Judgment and thought content normal.        Assessment & Plan:   1. Attention deficit hyperactivity disorder (ADHD), combined type - methylphenidate 54 MG PO CR tablet; Take 1 tablet (54 mg total) by mouth every  morning.  Dispense: 30 tablet; Refill: 0 - methylphenidate 54 MG PO CR tablet; Take 1 tablet (54 mg total) by mouth every morning.  Dispense: 30 tablet; Refill: 0 - methylphenidate 54 MG PO CR tablet; Take 1 tablet (54 mg total) by mouth every morning.  Dispense: 30 tablet; Refill: 0 - buPROPion (WELLBUTRIN XL) 300 MG 24 hr tablet; Take 1 tablet (300 mg total) by mouth daily.  Dispense: 30 tablet; Refill: 5  2. Onychomycosis - terbinafine (LAMISIL) 250 MG tablet; Take 1 tablet (250 mg total) by mouth daily.  Dispense: 30 tablet; Refill: 3  3. Skin rash - clobetasol cream (TEMOVATE) 0.05 %; Apply 1 application topically 2 (two) times daily.  Dispense: 30 g; Refill: 0   Continue all other maintenance medications as listed above.  Follow up plan: Return in about 3 months (around 06/07/2018) for recheck.  Educational handout given for survey  Remus Loffler PA-C [facility] [address]  [place], [place] [address] [phone]   03/08/2018, 2:20 PM

## 2018-03-08 NOTE — Telephone Encounter (Signed)
I did all three yesterday

## 2018-06-09 ENCOUNTER — Encounter: Payer: Self-pay | Admitting: Physician Assistant

## 2018-06-09 ENCOUNTER — Ambulatory Visit: Payer: BLUE CROSS/BLUE SHIELD | Admitting: Physician Assistant

## 2018-06-09 DIAGNOSIS — F902 Attention-deficit hyperactivity disorder, combined type: Secondary | ICD-10-CM | POA: Diagnosis not present

## 2018-06-09 MED ORDER — METHYLPHENIDATE HCL ER (OSM) 54 MG PO TBCR: 54.0000 mg | EXTENDED_RELEASE_TABLET | ORAL | 0 refills | Status: DC

## 2018-06-12 NOTE — Progress Notes (Signed)
BP 93/60   Pulse 86   Temp 98.5 F (36.9 C) (Oral)   Ht 5\' 1"  (1.549 m)   Wt 235 lb 3.2 oz (106.7 kg)   BMI 44.44 kg/m    Subjective:    Patient ID: Erica Galloway, female    DOB: May 13, 1971, 47 y.o.   MRN: [id number]  HPI: Erica Galloway is a 47 y.o. female presenting on 06/09/2018 for ADHD  This patient comes in for periodic recheck on medications and conditions including ADHD. New contract reviewed and signed.   All medications are reviewed today. There are no reports of any problems with the medications. All of the medical conditions are reviewed and updated.  Lab work is reviewed and will be ordered as medically necessary. There are no new problems reported with today's visit.   Past Medical History:  Diagnosis Date  . Allergy   . Arthritis   . Carpal tunnel syndrome, bilateral    Relevant past medical, surgical, family and social history reviewed and updated as indicated. Interim medical history since our last visit reviewed. Allergies and medications reviewed and updated. DATA REVIEWED: CHART IN EPIC  Family History reviewed for pertinent findings.  Review of Systems  Constitutional: Negative.  Negative for activity change, fatigue and fever.  HENT: Negative.   Eyes: Negative.   Respiratory: Negative.  Negative for cough.   Cardiovascular: Negative.  Negative for chest pain.  Gastrointestinal: Negative.  Negative for abdominal pain.  Endocrine: Negative.   Genitourinary: Negative.  Negative for dysuria.  Musculoskeletal: Negative.   Skin: Negative.   Neurological: Negative.     Allergies as of 06/09/2018      Reactions   Sulfur       Medication List        Accurate as of 06/09/18 11:59 PM. Always use your most recent med list.          buPROPion 300 MG 24 hr tablet Commonly known as:  WELLBUTRIN XL Take 1 tablet (300 mg total) by mouth daily.   cetirizine 10 MG tablet Commonly known as:  ZYRTEC Take 10 mg by mouth daily.   clobetasol cream  0.05 % Commonly known as:  TEMOVATE Apply 1 application topically 2 (two) times daily.   fluticasone 50 MCG/ACT nasal spray Commonly known as:  FLONASE Place into both nostrils daily as needed for allergies or rhinitis.   methylphenidate 54 MG CR tablet Commonly known as:  CONCERTA Take 1 tablet (54 mg total) by mouth every morning.   methylphenidate 54 MG CR tablet Commonly known as:  CONCERTA Take 1 tablet (54 mg total) by mouth every morning.   methylphenidate 54 MG CR tablet Commonly known as:  CONCERTA Take 1 tablet (54 mg total) by mouth every morning.   omeprazole 20 MG capsule Commonly known as:  PRILOSEC Take 1 capsule (20 mg total) by mouth daily.   vitamin C 1000 MG tablet Take 1,000 mg by mouth daily.          Objective:    BP 93/60   Pulse 86   Temp 98.5 F (36.9 C) (Oral)   Ht 5\' 1"  (1.549 m)   Wt 235 lb 3.2 oz (106.7 kg)   BMI 44.44 kg/m   Allergies  Allergen Reactions  . Sulfur     Wt Readings from Last 3 Encounters:  06/09/18 235 lb 3.2 oz (106.7 kg)  03/07/18 222 lb (100.7 kg)  12/07/17 209 lb 9.6 oz (95.1 kg)  Physical Exam  Constitutional: She is oriented to person, place, and time. She appears well-developed and well-nourished.  HENT:  Head: Normocephalic and atraumatic.  Eyes: Pupils are equal, round, and reactive to light. Conjunctivae and EOM are normal.  Cardiovascular: Normal rate, regular rhythm, normal heart sounds and intact distal pulses.  Pulmonary/Chest: Effort normal and breath sounds normal.  Abdominal: Soft. Bowel sounds are normal.  Neurological: She is alert and oriented to person, place, and time. She has normal reflexes.  Skin: Skin is warm and dry. No rash noted.  Psychiatric: She has a normal mood and affect. Her behavior is normal. Judgment and thought content normal.        Assessment & Plan:   1. Attention deficit hyperactivity disorder (ADHD), combined type - methylphenidate 54 MG PO CR tablet; Take 1  tablet (54 mg total) by mouth every morning.  Dispense: 30 tablet; Refill: 0 - methylphenidate 54 MG PO CR tablet; Take 1 tablet (54 mg total) by mouth every morning.  Dispense: 30 tablet; Refill: 0 - methylphenidate 54 MG PO CR tablet; Take 1 tablet (54 mg total) by mouth every morning.  Dispense: 30 tablet; Refill: 0   Continue all other maintenance medications as listed above.  Follow up plan: Return in about 3 months (around 09/09/2018) for recheck.  Educational handout given for survey  Remus Loffler PA-C [facility] [address]  [place], [place] [address] [phone]   06/12/2018, 5:41 PM

## 2018-07-10 ENCOUNTER — Encounter: Payer: Self-pay | Admitting: Physician Assistant

## 2018-07-10 ENCOUNTER — Ambulatory Visit: Payer: BLUE CROSS/BLUE SHIELD | Admitting: Physician Assistant

## 2018-07-10 VITALS — BP 106/71 | HR 92 | Temp 98.6°F | Ht 61.0 in | Wt 232.6 lb

## 2018-07-10 DIAGNOSIS — J0110 Acute frontal sinusitis, unspecified: Secondary | ICD-10-CM

## 2018-07-10 DIAGNOSIS — A084 Viral intestinal infection, unspecified: Secondary | ICD-10-CM

## 2018-07-10 MED ORDER — ONDANSETRON 8 MG PO TBDP: 8.0000 mg | ORAL_TABLET | Freq: Three times a day (TID) | ORAL | 0 refills | Status: DC | PRN

## 2018-07-10 MED ORDER — AMOXICILLIN 500 MG PO CAPS: 500.0000 mg | ORAL_CAPSULE | Freq: Three times a day (TID) | ORAL | 0 refills | Status: DC

## 2018-07-10 NOTE — Progress Notes (Signed)
BP 106/71   Pulse 92   Temp 98.6 F (37 C) (Oral)   Ht 5\' 1"  (1.549 m)   Wt 232 lb 9.6 oz (105.5 kg)   BMI 43.95 kg/m    Subjective:    Patient ID: Erica Galloway, female    DOB: 1970/11/19, 46 y.o.   MRN: [id number]  HPI: Erica Galloway is a 47 y.o. female presenting on 07/10/2018 for Nausea and Headache  This patient comes in with 2 day history of nausea and vomiting.  In the beginning nausea and vomiting were the only symptoms and halfway through more diarrhea.  Last time the patient ate was yesterday Positive exposure to others with gastroenteritis Denies fever. Denies blood.  This patient has had many days of sinus headache and postnasal drainage. There is copious drainage at times. Denies any fever at this time. There has been a history of sinus infections in the past.  No history of sinus surgery. There is cough at night. It has become more prevalent in recent days.   Past Medical History:  Diagnosis Date  . Allergy   . Arthritis   . Carpal tunnel syndrome, bilateral    Relevant past medical, surgical, family and social history reviewed and updated as indicated. Interim medical history since our last visit reviewed. Allergies and medications reviewed and updated. DATA REVIEWED: CHART IN EPIC  Family History reviewed for pertinent findings.  Review of Systems  Constitutional: Positive for chills and fatigue. Negative for activity change and appetite change.  HENT: Positive for congestion, postnasal drip and sore throat.   Eyes: Negative.   Respiratory: Negative for cough and wheezing.   Cardiovascular: Negative.  Negative for chest pain, palpitations and leg swelling.  Gastrointestinal: Positive for abdominal pain, nausea and vomiting.  Genitourinary: Negative.   Musculoskeletal: Negative.   Skin: Negative.   Neurological: Positive for headaches.    Allergies as of 07/10/2018      Reactions   Sulfur       Medication List        Accurate as of 07/10/18   3:48 PM. Always use your most recent med list.          amoxicillin 500 MG capsule Commonly known as:  AMOXIL Take 1 capsule (500 mg total) by mouth 3 (three) times daily.   buPROPion 300 MG 24 hr tablet Commonly known as:  WELLBUTRIN XL Take 1 tablet (300 mg total) by mouth daily.   cetirizine 10 MG tablet Commonly known as:  ZYRTEC Take 10 mg by mouth daily.   clobetasol cream 0.05 % Commonly known as:  TEMOVATE Apply 1 application topically 2 (two) times daily.   fluticasone 50 MCG/ACT nasal spray Commonly known as:  FLONASE Place into both nostrils daily as needed for allergies or rhinitis.   methylphenidate 54 MG CR tablet Commonly known as:  CONCERTA Take 1 tablet (54 mg total) by mouth every morning.   methylphenidate 54 MG CR tablet Commonly known as:  CONCERTA Take 1 tablet (54 mg total) by mouth every morning.   methylphenidate 54 MG CR tablet Commonly known as:  CONCERTA Take 1 tablet (54 mg total) by mouth every morning.   omeprazole 20 MG capsule Commonly known as:  PRILOSEC Take 1 capsule (20 mg total) by mouth daily.   ondansetron 8 MG disintegrating tablet Commonly known as:  ZOFRAN-ODT Take 1 tablet (8 mg total) by mouth every 8 (eight) hours as needed for nausea or vomiting.   vitamin C  1000 MG tablet Take 1,000 mg by mouth daily.          Objective:    BP 106/71   Pulse 92   Temp 98.6 F (37 C) (Oral)   Ht 5\' 1"  (1.549 m)   Wt 232 lb 9.6 oz (105.5 kg)   BMI 43.95 kg/m   Allergies  Allergen Reactions  . Sulfur     Wt Readings from Last 3 Encounters:  07/10/18 232 lb 9.6 oz (105.5 kg)  06/09/18 235 lb 3.2 oz (106.7 kg)  03/07/18 222 lb (100.7 kg)    Physical Exam  Constitutional: She is oriented to person, place, and time. She appears well-developed and well-nourished.  HENT:  Head: Normocephalic and atraumatic.  Right Ear: Tympanic membrane and external ear normal. No middle ear effusion.  Left Ear: Tympanic membrane and  external ear normal.  No middle ear effusion.  Nose: Mucosal edema and rhinorrhea present. Right sinus exhibits no maxillary sinus tenderness. Left sinus exhibits no maxillary sinus tenderness.  Mouth/Throat: Uvula is midline. Posterior oropharyngeal erythema present.  Eyes: Pupils are equal, round, and reactive to light. Conjunctivae and EOM are normal. Right eye exhibits no discharge. Left eye exhibits no discharge.  Neck: Normal range of motion.  Cardiovascular: Normal rate, regular rhythm, normal heart sounds and intact distal pulses.  Pulmonary/Chest: Effort normal and breath sounds normal. No respiratory distress. She has no wheezes.  Abdominal: Soft. She exhibits no distension and no mass. Bowel sounds are increased. There is tenderness in the right upper quadrant, epigastric area and left upper quadrant. There is no rebound and no guarding.  Lymphadenopathy:    She has no cervical adenopathy.  Neurological: She is alert and oriented to person, place, and time. She has normal reflexes.  Skin: Skin is warm and dry. No rash noted.  Psychiatric: She has a normal mood and affect. Her behavior is normal. Judgment and thought content normal.        Assessment & Plan:   1. Viral gastroenteritis - ondansetron (ZOFRAN ODT) 8 MG disintegrating tablet; Take 1 tablet (8 mg total) by mouth every 8 (eight) hours as needed for nausea or vomiting.  Dispense: 20 tablet; Refill: 0  2. Acute non-recurrent frontal sinusitis - amoxicillin (AMOXIL) 500 MG capsule; Take 1 capsule (500 mg total) by mouth 3 (three) times daily.  Dispense: 30 capsule; Refill: 0   Continue all other maintenance medications as listed above.  Follow up plan: No follow-ups on file.  Educational handout given for survey  Remus Loffler PA-C [facility] [address]  [place], [place] [address] [phone]   07/10/2018, 3:48 PM

## 2018-07-11 ENCOUNTER — Other Ambulatory Visit: Payer: Self-pay | Admitting: Physician Assistant

## 2018-07-11 ENCOUNTER — Telehealth: Payer: Self-pay | Admitting: Physician Assistant

## 2018-07-11 ENCOUNTER — Encounter: Payer: Self-pay | Admitting: Physician Assistant

## 2018-07-11 MED ORDER — SUMATRIPTAN SUCCINATE 50 MG PO TABS: 50.0000 mg | ORAL_TABLET | ORAL | 0 refills | Status: DC | PRN

## 2018-07-11 NOTE — Telephone Encounter (Signed)
Sent imitrex for headache

## 2018-07-11 NOTE — Telephone Encounter (Signed)
What symptoms do you have? Was seen 9-30 with Lawanna Kobus and she is still having the headache and wants something called in . Has taken the ABX and the nausea pill and head still hurting. Needs note for work also  How long have you been sick? Sunday night  Have you been seen for this problem? 07-10-18   If your provider decides to give you a prescription, which pharmacy would you like for it to be sent to? [facility] in [place]  Patient informed that this information will be sent to the clinical staff for review and that they should receive a follow up call.

## 2018-07-11 NOTE — Telephone Encounter (Signed)
Patient aware.

## 2018-09-14 ENCOUNTER — Encounter: Payer: Self-pay | Admitting: Physician Assistant

## 2018-09-14 ENCOUNTER — Ambulatory Visit: Payer: BLUE CROSS/BLUE SHIELD | Admitting: Physician Assistant

## 2018-09-14 VITALS — BP 137/74 | HR 85 | Ht 61.0 in | Wt 246.0 lb

## 2018-09-14 DIAGNOSIS — R21 Rash and other nonspecific skin eruption: Secondary | ICD-10-CM | POA: Diagnosis not present

## 2018-09-14 DIAGNOSIS — H6692 Otitis media, unspecified, left ear: Secondary | ICD-10-CM

## 2018-09-14 DIAGNOSIS — H6502 Acute serous otitis media, left ear: Secondary | ICD-10-CM

## 2018-09-14 DIAGNOSIS — F902 Attention-deficit hyperactivity disorder, combined type: Secondary | ICD-10-CM | POA: Diagnosis not present

## 2018-09-14 MED ORDER — CEPHALEXIN 500 MG PO CAPS: 500.0000 mg | ORAL_CAPSULE | Freq: Four times a day (QID) | ORAL | 0 refills | Status: DC

## 2018-09-14 MED ORDER — METHYLPHENIDATE HCL ER (OSM) 54 MG PO TBCR: 54.0000 mg | EXTENDED_RELEASE_TABLET | ORAL | 0 refills | Status: DC

## 2018-09-14 MED ORDER — CLOBETASOL PROPIONATE 0.05 % EX CREA: 1.0000 "application " | TOPICAL_CREAM | Freq: Two times a day (BID) | CUTANEOUS | 2 refills | Status: DC

## 2018-09-17 NOTE — Progress Notes (Signed)
BP 137/74   Pulse 85   Ht 5\' 1"  (1.549 m)   Wt 246 lb (111.6 kg)   BMI 46.48 kg/m    Subjective:    Patient ID: Erica Galloway, female    DOB: 05/06/1971, 47 y.o.   MRN: [id number]  HPI: Erica Galloway is a 47 y.o. female presenting on 09/14/2018 for ADHD (3 month follow up )  This patient comes in for periodic recheck on medications and conditions including ADHD, chronic allergic reaction.  This patient has had many days of sore throat and postnasal drainage, headache at times and sinus pressure. There is copious drainage at times. Denies any fever at this time. There has been a history of sinus infections in the past.  There is cough at night. It has become more prevalent in recent days. .   All medications are reviewed today. There are no reports of any problems with the medications. All of the medical conditions are reviewed and updated.  Lab work is reviewed and will be ordered as medically necessary. There are no new problems reported with today's visit.   Past Medical History:  Diagnosis Date  . Allergy   . Arthritis   . Carpal tunnel syndrome, bilateral    Relevant past medical, surgical, family and social history reviewed and updated as indicated. Interim medical history since our last visit reviewed. Allergies and medications reviewed and updated. DATA REVIEWED: CHART IN EPIC  Family History reviewed for pertinent findings.  Review of Systems  Constitutional: Positive for chills and fatigue. Negative for activity change, appetite change and fever.  HENT: Positive for congestion, ear pain, postnasal drip and sore throat.   Eyes: Negative.   Respiratory: Negative for cough and wheezing.   Cardiovascular: Negative.  Negative for chest pain, palpitations and leg swelling.  Gastrointestinal: Negative.   Genitourinary: Negative.   Musculoskeletal: Negative.   Skin: Negative.   Neurological: Positive for headaches.    Allergies as of 09/14/2018      Reactions   Sulfur       Medication List        Accurate as of 09/14/18 11:59 PM. Always use your most recent med list.          buPROPion 300 MG 24 hr tablet Commonly known as:  WELLBUTRIN XL Take 1 tablet (300 mg total) by mouth daily.   cephALEXin 500 MG capsule Commonly known as:  KEFLEX Take 1 capsule (500 mg total) by mouth 4 (four) times daily.   cetirizine 10 MG tablet Commonly known as:  ZYRTEC Take 10 mg by mouth daily.   clobetasol cream 0.05 % Commonly known as:  TEMOVATE Apply 1 application topically 2 (two) times daily.   fluticasone 50 MCG/ACT nasal spray Commonly known as:  FLONASE Place into both nostrils daily as needed for allergies or rhinitis.   methylphenidate 54 MG CR tablet Commonly known as:  CONCERTA Take 1 tablet (54 mg total) by mouth every morning.   methylphenidate 54 MG CR tablet Commonly known as:  CONCERTA Take 1 tablet (54 mg total) by mouth every morning.   methylphenidate 54 MG CR tablet Commonly known as:  CONCERTA Take 1 tablet (54 mg total) by mouth every morning.   omeprazole 20 MG capsule Commonly known as:  PRILOSEC Take 1 capsule (20 mg total) by mouth daily.   ondansetron 8 MG disintegrating tablet Commonly known as:  ZOFRAN-ODT Take 1 tablet (8 mg total) by mouth every 8 (eight) hours as needed  for nausea or vomiting.   SUMAtriptan 50 MG tablet Commonly known as:  IMITREX Take 1 tablet (50 mg total) by mouth every 2 (two) hours as needed for migraine. May repeat in 2 hours if headache persists or recurs.   vitamin C 1000 MG tablet Take 1,000 mg by mouth daily.          Objective:    BP 137/74   Pulse 85   Ht 5\' 1"  (1.549 m)   Wt 246 lb (111.6 kg)   BMI 46.48 kg/m   Allergies  Allergen Reactions  . Sulfur     Wt Readings from Last 3 Encounters:  09/14/18 246 lb (111.6 kg)  07/10/18 232 lb 9.6 oz (105.5 kg)  06/09/18 235 lb 3.2 oz (106.7 kg)    Physical Exam  Constitutional: She is oriented to person,  place, and time. She appears well-developed and well-nourished.  HENT:  Head: Normocephalic and atraumatic.  Right Ear: A middle ear effusion is present.  Left Ear: A middle ear effusion is present.  Nose: Mucosal edema present. Right sinus exhibits no frontal sinus tenderness. Left sinus exhibits no frontal sinus tenderness.  Mouth/Throat: Posterior oropharyngeal erythema present. No oropharyngeal exudate or tonsillar abscesses.  Eyes: Pupils are equal, round, and reactive to light. Conjunctivae and EOM are normal.  Neck: Normal range of motion.  Cardiovascular: Normal rate, regular rhythm, normal heart sounds and intact distal pulses.  Pulmonary/Chest: Effort normal and breath sounds normal.  Abdominal: Soft. Bowel sounds are normal.  Neurological: She is alert and oriented to person, place, and time. She has normal reflexes.  Skin: Skin is warm and dry. No rash noted.  Psychiatric: She has a normal mood and affect. Her behavior is normal. Judgment and thought content normal.  Nursing note and vitals reviewed.       Assessment & Plan:   1. Attention deficit hyperactivity disorder (ADHD), combined type - methylphenidate 54 MG PO CR tablet; Take 1 tablet (54 mg total) by mouth every morning.  Dispense: 30 tablet; Refill: 0 - methylphenidate 54 MG PO CR tablet; Take 1 tablet (54 mg total) by mouth every morning.  Dispense: 30 tablet; Refill: 0 - methylphenidate 54 MG PO CR tablet; Take 1 tablet (54 mg total) by mouth every morning.  Dispense: 30 tablet; Refill: 0  2. Acute infection of left ear - cephALEXin (KEFLEX) 500 MG capsule; Take 1 capsule (500 mg total) by mouth 4 (four) times daily.  Dispense: 40 capsule; Refill: 0  3. Skin rash - clobetasol cream (TEMOVATE) 0.05 %; Apply 1 application topically 2 (two) times daily.  Dispense: 60 g; Refill: 2   Continue all other maintenance medications as listed above.  Follow up plan: Return in about 3 months (around 12/14/2018) for  recheck.  Educational handout given for survey  Remus Loffler PA-C [facility] [address]  [place], [place] [address] [phone]   09/17/2018, 9:54 PM

## 2018-09-30 ENCOUNTER — Other Ambulatory Visit: Payer: Self-pay | Admitting: Physician Assistant

## 2018-09-30 DIAGNOSIS — F902 Attention-deficit hyperactivity disorder, combined type: Secondary | ICD-10-CM

## 2018-10-02 ENCOUNTER — Encounter: Payer: Self-pay | Admitting: Physician Assistant

## 2018-10-02 DIAGNOSIS — F902 Attention-deficit hyperactivity disorder, combined type: Secondary | ICD-10-CM

## 2018-10-02 MED ORDER — BUPROPION HCL ER (XL) 300 MG PO TB24: 300.0000 mg | ORAL_TABLET | Freq: Every day | ORAL | 5 refills | Status: DC

## 2018-10-12 ENCOUNTER — Encounter: Payer: Self-pay | Admitting: Physician Assistant

## 2018-10-12 ENCOUNTER — Other Ambulatory Visit: Payer: Self-pay | Admitting: Physician Assistant

## 2018-10-12 MED ORDER — AMOXICILLIN 500 MG PO CAPS: 500.0000 mg | ORAL_CAPSULE | Freq: Three times a day (TID) | ORAL | 0 refills | Status: DC

## 2018-10-12 MED ORDER — HYDROCODONE-HOMATROPINE 5-1.5 MG/5ML PO SYRP: 5.0000 mL | ORAL_SOLUTION | Freq: Three times a day (TID) | ORAL | 0 refills | Status: DC | PRN

## 2018-10-16 ENCOUNTER — Other Ambulatory Visit: Payer: Self-pay | Admitting: Physician Assistant

## 2018-10-16 ENCOUNTER — Encounter: Payer: Self-pay | Admitting: Physician Assistant

## 2018-10-16 MED ORDER — PROMETHAZINE HCL 25 MG PO TABS: 25.0000 mg | ORAL_TABLET | Freq: Three times a day (TID) | ORAL | 0 refills | Status: DC | PRN

## 2018-10-16 MED ORDER — AZITHROMYCIN 250 MG PO TABS: ORAL_TABLET | ORAL | 0 refills | Status: DC

## 2018-10-17 ENCOUNTER — Encounter: Payer: Self-pay | Admitting: *Deleted

## 2018-10-17 ENCOUNTER — Encounter: Payer: Self-pay | Admitting: Physician Assistant

## 2018-12-15 ENCOUNTER — Encounter: Payer: Self-pay | Admitting: Physician Assistant

## 2018-12-15 ENCOUNTER — Ambulatory Visit: Payer: BLUE CROSS/BLUE SHIELD | Admitting: Physician Assistant

## 2018-12-15 DIAGNOSIS — F902 Attention-deficit hyperactivity disorder, combined type: Secondary | ICD-10-CM

## 2018-12-15 MED ORDER — METHYLPHENIDATE HCL ER (OSM) 54 MG PO TBCR: 54.0000 mg | EXTENDED_RELEASE_TABLET | ORAL | 0 refills | Status: DC

## 2018-12-15 NOTE — Patient Instructions (Signed)
Your provider wants you to schedule an appointment with a Psychologist/Psychiatrist. The following list of offices requires the patient to call and make their own appointment, as there is information they need that only you can provide. Please feel free to choose form the following providers:  [facility]   [phone] Crisis Recovery in [place] [phone]  [facility]  [phone]   [address] [place], [place]  (Scheduled through [facility]) Must call and do an interview for appointment. Sees Children / Accepts Medicaid  [facility]    [phone]  [address]    [place], [place]       [facility]  [phone] [address] [place], [place]  Evaluates for Autism but does not treat it Sees Children / Accepts Medicaid  [facility]    [phone] [address]   [place], [place] Medication management, substance abuse, bipolar, grief, family, marriage, OCD, anxiety, PTSD Sees children / Accepts Medicaid  [facility]    [phone] [address] [place], [place] Sees children / Accepts Medicaid  [facility]  [phone] [address] [place], [place]   Dr Akinlayo     [phone] [address] [place], [place]  Sees ADD & ADHD for treatment Accepts Medicaid  [facility]  [phone] [address] [place], [place] Evaluates for Autism Accepts Medicaid  [facility]  [phone] [address] [place], [place]  Does Adult ADD evaluations Does not accept Medicaid  [facility]   [phone] [address]   [place], [place] Uses animal therapy  Sees children as young as 3 years old Accepts Medicaid  [facility]     [phone]    [address]  [place],  [address] Sees children Accepts Medicaid  

## 2018-12-17 NOTE — Progress Notes (Signed)
BP 102/68   Pulse 84   Temp 97.7 F (36.5 C) (Oral)   Ht '5\' 1"'  (1.549 m)   Wt 232 lb (105.2 kg)   BMI 43.84 kg/m    Subjective:    Patient ID: Erica Galloway, female    DOB: [birth year], 48 y.o.   MRN: [id number]  HPI: Erica Galloway is a 48 y.o. female presenting on 12/15/2018 for ADHD (3 month)  This patient returns for a 3 month recheck on narcotic use for the above named conditions  Patient currently taking Concerta 64 mg one daily. Behavior- normal Medication side effects- no Any concerns- no  Effectiveness of current meds- good Adverse reactions from meds-no PMP AWARE website reviewed: Yes Any suspicious activity on PMP Aware: No  Contract on file Last UDS  12/15/2018  History of overdose or risk of abuse no   Past Medical History:  Diagnosis Date  . Allergy   . Arthritis   . Carpal tunnel syndrome, bilateral    Relevant past medical, surgical, family and social history reviewed and updated as indicated. Interim medical history since our last visit reviewed. Allergies and medications reviewed and updated. DATA REVIEWED: CHART IN EPIC  Family History reviewed for pertinent findings.  Review of Systems  Constitutional: Negative.   HENT: Negative.   Eyes: Negative.   Respiratory: Negative.   Gastrointestinal: Negative.   Genitourinary: Negative.     Allergies as of 12/15/2018      Reactions   Sulfur       Medication List       Accurate as of December 15, 2018 11:59 PM. Always use your most recent med list.        buPROPion 300 MG 24 hr tablet Commonly known as:  WELLBUTRIN XL Take 1 tablet (300 mg total) by mouth daily.   cetirizine 10 MG tablet Commonly known as:  ZYRTEC Take 10 mg by mouth daily.   clobetasol cream 0.05 % Commonly known as:  TEMOVATE Apply 1 application topically 2 (two) times daily.   fluticasone 50 MCG/ACT nasal spray Commonly known as:  FLONASE Place into both nostrils daily as needed for allergies or rhinitis.     methylphenidate 54 MG CR tablet Commonly known as:  CONCERTA Take 1 tablet (54 mg total) by mouth every morning.   methylphenidate 54 MG CR tablet Commonly known as:  CONCERTA Take 1 tablet (54 mg total) by mouth every morning.   methylphenidate 54 MG CR tablet Commonly known as:  CONCERTA Take 1 tablet (54 mg total) by mouth every morning.   omeprazole 20 MG capsule Commonly known as:  PRILOSEC Take 1 capsule (20 mg total) by mouth daily.   SUMAtriptan 50 MG tablet Commonly known as:  Imitrex Take 1 tablet (50 mg total) by mouth every 2 (two) hours as needed for migraine. May repeat in 2 hours if headache persists or recurs.   vitamin C 1000 MG tablet Take 1,000 mg by mouth daily.          Objective:    BP 102/68   Pulse 84   Temp 97.7 F (36.5 C) (Oral)   Ht '5\' 1"'  (1.549 m)   Wt 232 lb (105.2 kg)   BMI 43.84 kg/m   Allergies  Allergen Reactions  . Sulfur     Wt Readings from Last 3 Encounters:  12/15/18 232 lb (105.2 kg)  09/14/18 246 lb (111.6 kg)  07/10/18 232 lb 9.6 oz (105.5 kg)    Physical  Exam Constitutional:      Appearance: She is well-developed.  HENT:     Head: Normocephalic and atraumatic.  Eyes:     Conjunctiva/sclera: Conjunctivae normal.     Pupils: Pupils are equal, round, and reactive to light.  Cardiovascular:     Rate and Rhythm: Normal rate and regular rhythm.     Heart sounds: Normal heart sounds.  Pulmonary:     Effort: Pulmonary effort is normal.     Breath sounds: Normal breath sounds.  Abdominal:     General: Bowel sounds are normal.     Palpations: Abdomen is soft.  Skin:    General: Skin is warm and dry.     Findings: No rash.  Neurological:     Mental Status: She is alert and oriented to person, place, and time.     Deep Tendon Reflexes: Reflexes are normal and symmetric.  Psychiatric:        Behavior: Behavior normal.        Thought Content: Thought content normal.        Judgment: Judgment normal.      Results for orders placed or performed in visit on 11/08/17  CBC with Differential/Platelet  Result Value Ref Range   WBC 6.5 3.4 - 10.8 x10E3/uL   RBC 4.35 3.77 - 5.28 x10E6/uL   Hemoglobin 13.0 11.1 - 15.9 g/dL   Hematocrit 39.2 34.0 - 46.6 %   MCV 90 79 - 97 fL   MCH 29.9 26.6 - 33.0 pg   MCHC 33.2 31.5 - 35.7 g/dL   RDW 12.6 12.3 - 15.4 %   Platelets 317 150 - 379 x10E3/uL   Neutrophils 63 Not Estab. %   Lymphs 22 Not Estab. %   Monocytes 12 Not Estab. %   Eos 2 Not Estab. %   Basos 1 Not Estab. %   Neutrophils Absolute 4.1 1.4 - 7.0 x10E3/uL   Lymphocytes Absolute 1.5 0.7 - 3.1 x10E3/uL   Monocytes Absolute 0.8 0.1 - 0.9 x10E3/uL   EOS (ABSOLUTE) 0.1 0.0 - 0.4 x10E3/uL   Basophils Absolute 0.0 0.0 - 0.2 x10E3/uL   Immature Granulocytes 0 Not Estab. %   Immature Grans (Abs) 0.0 0.0 - 0.1 x10E3/uL  CMP14+EGFR  Result Value Ref Range   Glucose 73 65 - 99 mg/dL   BUN 18 6 - 24 mg/dL   Creatinine, Ser 0.53 (L) 0.57 - 1.00 mg/dL   GFR calc non Af Amer 113 >59 mL/min/1.73   GFR calc Af Amer 130 >59 mL/min/1.73   BUN/Creatinine Ratio 34 (H) 9 - 23   Sodium 141 134 - 144 mmol/L   Potassium 4.9 3.5 - 5.2 mmol/L   Chloride 104 96 - 106 mmol/L   CO2 21 20 - 29 mmol/L   Calcium 9.6 8.7 - 10.2 mg/dL   Total Protein 7.1 6.0 - 8.5 g/dL   Albumin 4.4 3.5 - 5.5 g/dL   Globulin, Total 2.7 1.5 - 4.5 g/dL   Albumin/Globulin Ratio 1.6 1.2 - 2.2   Bilirubin Total 0.5 0.0 - 1.2 mg/dL   Alkaline Phosphatase 84 39 - 117 IU/L   AST 16 0 - 40 IU/L   ALT 13 0 - 32 IU/L  Lipid panel  Result Value Ref Range   Cholesterol, Total 157 100 - 199 mg/dL   Triglycerides 35 0 - 149 mg/dL   HDL 71 >39 mg/dL   VLDL Cholesterol Cal 7 5 - 40 mg/dL   LDL Calculated 79 0 - 99 mg/dL  Chol/HDL Ratio 2.2 0.0 - 4.4 ratio  Thyroid Panel With TSH  Result Value Ref Range   TSH 1.570 0.450 - 4.500 uIU/mL   T4, Total 6.9 4.5 - 12.0 ug/dL   T3 Uptake Ratio 27 24 - 39 %   Free Thyroxine Index 1.9 1.2  - 4.9      Assessment & Plan:   1. Attention deficit hyperactivity disorder (ADHD), combined type - methylphenidate 54 MG PO CR tablet; Take 1 tablet (54 mg total) by mouth every morning.  Dispense: 30 tablet; Refill: 0 - methylphenidate 54 MG PO CR tablet; Take 1 tablet (54 mg total) by mouth every morning.  Dispense: 30 tablet; Refill: 0 - methylphenidate 54 MG PO CR tablet; Take 1 tablet (54 mg total) by mouth every morning.  Dispense: 30 tablet; Refill: 0 - ToxASSURE Select 13 (MW), Urine   Continue all other maintenance medications as listed above.  Follow up plan: Return in about 3 months (around 03/17/2019).  Educational handout given for [place] PA-C [facility] [address]  [place], [place] [address] [phone]   12/17/2018, 10:32 PM

## 2018-12-21 LAB — TOXASSURE SELECT 13 (MW), URINE

## 2018-12-28 ENCOUNTER — Encounter: Payer: Self-pay | Admitting: Physician Assistant

## 2019-01-03 ENCOUNTER — Encounter: Payer: Self-pay | Admitting: Physician Assistant

## 2019-01-03 ENCOUNTER — Other Ambulatory Visit: Payer: Self-pay | Admitting: Physician Assistant

## 2019-01-03 MED ORDER — BENZONATATE 200 MG PO CAPS: 200.0000 mg | ORAL_CAPSULE | Freq: Two times a day (BID) | ORAL | 0 refills | Status: DC | PRN

## 2019-01-03 MED ORDER — AMOXICILLIN 500 MG PO CAPS: 500.0000 mg | ORAL_CAPSULE | Freq: Three times a day (TID) | ORAL | 0 refills | Status: DC

## 2019-03-15 ENCOUNTER — Telehealth: Payer: Self-pay | Admitting: Physician Assistant

## 2019-03-16 ENCOUNTER — Ambulatory Visit: Payer: BLUE CROSS/BLUE SHIELD | Admitting: Physician Assistant

## 2019-03-16 ENCOUNTER — Encounter: Payer: Self-pay | Admitting: Physician Assistant

## 2019-03-16 ENCOUNTER — Other Ambulatory Visit: Payer: Self-pay

## 2019-03-16 VITALS — BP 136/94 | HR 124 | Temp 97.5°F | Ht 61.0 in | Wt 212.0 lb

## 2019-03-16 DIAGNOSIS — F902 Attention-deficit hyperactivity disorder, combined type: Secondary | ICD-10-CM | POA: Diagnosis not present

## 2019-03-16 DIAGNOSIS — K3184 Gastroparesis: Secondary | ICD-10-CM

## 2019-03-16 MED ORDER — METHYLPHENIDATE HCL ER (OSM) 54 MG PO TBCR: 54.0000 mg | EXTENDED_RELEASE_TABLET | ORAL | 0 refills | Status: DC

## 2019-03-16 MED ORDER — BUPROPION HCL ER (XL) 300 MG PO TB24: 300.0000 mg | ORAL_TABLET | Freq: Every day | ORAL | 5 refills | Status: DC

## 2019-03-16 NOTE — Patient Instructions (Signed)
Gastroparesis    Gastroparesis is a condition in which food takes longer than normal to empty from the stomach. The condition is usually long-lasting (chronic). It may also be called delayed gastric emptying.  There is no cure, but there are treatments and things that you can do at home to help relieve symptoms. Treating the underlying condition that causes gastroparesis can also help relieve symptoms.  What are the causes?  In many cases, the cause of this condition is not known. Possible causes include:  A hormone (endocrine) disorder, such as hypothyroidism or diabetes.  A nervous system disease, such as Parkinson's disease or multiple sclerosis.  Cancer, infection, or surgery that affects the stomach or vagus nerve. The vagus nerve runs from your chest, through your neck, to the lower part of your brain.  A connective tissue disorder, such as scleroderma.  Certain medicines.  What increases the risk?  You are more likely to develop this condition if you:  Have certain disorders or diseases, including:  An endocrine disorder.  An eating disorder.  Amyloidosis.  Scleroderma.  Parkinson's disease.  Multiple sclerosis.  Cancer or infection of the stomach or the vagus nerve.  Have had surgery on the stomach or vagus nerve.  Take certain medicines.  Are female.  What are the signs or symptoms?  Symptoms of this condition include:  Feeling full after eating very little.  Nausea.  Vomiting.  Heartburn.  Abdominal bloating.  Inconsistent blood sugar (glucose) levels on blood tests.  Lack of appetite.  Weight loss.  Acid from the stomach coming up into the esophagus (gastroesophageal reflux).  Sudden tightening (spasm) of the stomach, which can be painful.  Symptoms may come and go. Some people may not notice any symptoms.  How is this diagnosed?  This condition is diagnosed with tests, such as:  Tests that check how long it takes food to move through the stomach and intestines. These tests include:  Upper  gastrointestinal (GI) series. For this test, you drink a liquid that shows up well on X-rays, and then X-rays will be taken of your intestines.  Gastric emptying scintigraphy. For this test, you eat food that contains a small amount of radioactive material, and then scans are taken.  Wireless capsule GI monitoring system. For this test, you swallow a pill (capsule) that records information about how foods and fluid move through your stomach.  Gastric manometry. For this test, a tube is passed down your throat and into your stomach to measure electrical and muscular activity.  Endoscopy. For this test, a long, thin tube is passed down your throat and into your stomach to check for problems in your stomach lining.  Ultrasound. This test uses sound waves to create images of inside the body. This can help rule out gallbladder disease or pancreatitis as a cause of your symptoms.  How is this treated?  There is no cure for gastroparesis. Treatment may include:  Treating the underlying cause.  Managing your symptoms by making changes to your diet and exercise habits.  Taking medicines to control nausea and vomiting and to stimulate stomach muscles.  Getting food through a feeding tube in the hospital. This may be done in severe cases.  Having surgery to insert a device into your body that helps improve stomach emptying and control nausea and vomiting (gastric neurostimulator).  Follow these instructions at home:  Take over-the-counter and prescription medicines only as told by your health care provider.  Follow instructions from your health care provider   about eating or drinking restrictions. Your health care provider may recommend that you:  Eat smaller meals more often.  Eat low-fat foods.  Eat low-fiber forms of high-fiber foods. For example, eat cooked vegetables instead of raw vegetables.  Have only liquid foods instead of solid foods. Liquid foods are easier to digest.  Drink enough fluid to keep your urine pale  yellow.  Exercise as often as told by your health care provider.  Keep all follow-up visits as told by your health care provider. This is important.  Contact a health care provider if you:  Notice that your symptoms do not improve with treatment.  Have new symptoms.  Get help right away if you:  Have severe abdominal pain that does not improve with treatment.  Have nausea that is severe or does not go away.  Cannot drink fluids without vomiting.  Summary  Gastroparesis is a chronic condition in which food takes longer than normal to empty from the stomach.  Symptoms include nausea, vomiting, heartburn, abdominal bloating, and loss of appetite.  Eating smaller portions, and low-fat, low-fiber foods may help you manage your symptoms.  Get help right away if you have severe abdominal pain.  This information is not intended to replace advice given to you by your health care provider. Make sure you discuss any questions you have with your health care provider.  Document Released: 09/27/2005 Document Revised: 08/02/2017 Document Reviewed: 08/02/2017  Elsevier Interactive Patient Education  2019 [employer] Inc[employer]

## 2019-03-20 ENCOUNTER — Encounter: Payer: Self-pay | Admitting: Physician Assistant

## 2019-03-20 NOTE — Progress Notes (Signed)
BP (!) 136/94    Pulse (!) 124    Temp (!) 97.5 F (36.4 C) (Oral)    Ht 5\' 1"  (1.549 m)    Wt 212 lb (96.2 kg)    BMI 40.06 kg/m    Subjective:    Patient ID: Erica HopesKathy F Broxterman, female    DOB: 1971-09-26, 48 y.o.   MRN: [id number]  HPI: Erica Galloway is a 48 y.o. female presenting on 03/16/2019 for Medical Management of Chronic Issues  Patient comes in for.  Recheck on her chronic medical conditions.  They do include gastroparesis and ADHD.  She does need refills on her ADHD medications.  We will have drug screen obtained today.  Previously she had been a day without taking it and her drug screen showed no medication.  She waited and took the medication at a later time today and definitely has taken it today.  We will send the refills then.  The PDMP review shows no red flags  She is having a little bit of a episode of abdominal pain and bloating.  She has had issues with gastroparesis in the past.  She is trying to figure out if there is any dietary calls at this time.  She has been trying to keep a very low-carb diet.  She will just watch and wait.  In the past she has taken metoclopramide.   Past Medical History:  Diagnosis Date   Allergy    Arthritis    Carpal tunnel syndrome, bilateral    Relevant past medical, surgical, family and social history reviewed and updated as indicated. Interim medical history since our last visit reviewed. Allergies and medications reviewed and updated. DATA REVIEWED: CHART IN EPIC  Family History reviewed for pertinent findings.  Review of Systems  Constitutional: Negative.   HENT: Negative.   Eyes: Negative.   Respiratory: Negative.   Gastrointestinal: Positive for abdominal distention, abdominal pain, diarrhea and nausea. Negative for constipation and vomiting.  Genitourinary: Negative.     Allergies as of 03/16/2019      Reactions   Sulfur       Medication List       Accurate as of March 16, 2019 11:59 PM. If you have any questions, ask  your nurse or doctor.        STOP taking these medications   amoxicillin 500 MG capsule Commonly known as:  AMOXIL Stopped by:  Remus LofflerAngel S Jeziah Kretschmer, PA-C   benzonatate 200 MG capsule Commonly known as:  TESSALON Stopped by:  Remus LofflerAngel S Marcedes Tech, PA-C     TAKE these medications   buPROPion 300 MG 24 hr tablet Commonly known as:  WELLBUTRIN XL Take 1 tablet (300 mg total) by mouth daily.   cetirizine 10 MG tablet Commonly known as:  ZYRTEC Take 10 mg by mouth daily.   clobetasol cream 0.05 % Commonly known as:  TEMOVATE Apply 1 application topically 2 (two) times daily.   fluticasone 50 MCG/ACT nasal spray Commonly known as:  FLONASE Place into both nostrils daily as needed for allergies or rhinitis.   methylphenidate 54 MG CR tablet Commonly known as:  CONCERTA Take 1 tablet (54 mg total) by mouth every morning.   methylphenidate 54 MG CR tablet Commonly known as:  CONCERTA Take 1 tablet (54 mg total) by mouth every morning.   methylphenidate 54 MG CR tablet Commonly known as:  CONCERTA Take 1 tablet (54 mg total) by mouth every morning.   omeprazole 20 MG capsule Commonly  known as:  PRILOSEC Take 1 capsule (20 mg total) by mouth daily.   SUMAtriptan 50 MG tablet Commonly known as:  Imitrex Take 1 tablet (50 mg total) by mouth every 2 (two) hours as needed for migraine. May repeat in 2 hours if headache persists or recurs.   vitamin C 1000 MG tablet Take 1,000 mg by mouth daily.          Objective:    BP (!) 136/94    Pulse (!) 124    Temp (!) 97.5 F (36.4 C) (Oral)    Ht 5\' 1"  (1.549 m)    Wt 212 lb (96.2 kg)    BMI 40.06 kg/m   Allergies  Allergen Reactions   Sulfur     Wt Readings from Last 3 Encounters:  03/16/19 212 lb (96.2 kg)  12/15/18 232 lb (105.2 kg)  09/14/18 246 lb (111.6 kg)    Physical Exam Constitutional:      Appearance: She is well-developed.  HENT:     Head: Normocephalic and atraumatic.  Eyes:     Conjunctiva/sclera: Conjunctivae  normal.     Pupils: Pupils are equal, round, and reactive to light.  Cardiovascular:     Rate and Rhythm: Normal rate and regular rhythm.     Heart sounds: Normal heart sounds.  Pulmonary:     Effort: Pulmonary effort is normal.     Breath sounds: Normal breath sounds.  Abdominal:     General: Bowel sounds are normal.     Palpations: Abdomen is soft.  Skin:    General: Skin is warm and dry.     Findings: No rash.  Neurological:     Mental Status: She is alert and oriented to person, place, and time.     Deep Tendon Reflexes: Reflexes are normal and symmetric.  Psychiatric:        Behavior: Behavior normal.        Thought Content: Thought content normal.        Judgment: Judgment normal.     Results for orders placed or performed in visit on 12/15/18  ToxASSURE Select 13 (MW), Urine  Result Value Ref Range   Summary FINAL       Assessment & Plan:   1. Attention deficit hyperactivity disorder (ADHD), combined type - ToxASSURE Select 13 (MW), Urine - methylphenidate 54 MG PO CR tablet; Take 1 tablet (54 mg total) by mouth every morning.  Dispense: 30 tablet; Refill: 0 - methylphenidate 54 MG PO CR tablet; Take 1 tablet (54 mg total) by mouth every morning.  Dispense: 30 tablet; Refill: 0 - methylphenidate 54 MG PO CR tablet; Take 1 tablet (54 mg total) by mouth every morning.  Dispense: 30 tablet; Refill: 0 - buPROPion (WELLBUTRIN XL) 300 MG 24 hr tablet; Take 1 tablet (300 mg total) by mouth daily.  Dispense: 30 tablet; Refill: 5  2. Gastroparesis Watch diet and report if worsening, has taken metoclopramide    Continue all other maintenance medications as listed above.  Follow up plan: Return in about 3 months (around 06/16/2019).  Educational handout given for gastroparesis  Terald Sleeper PA-C [facility] [address]  [place], [place] [address] [phone]   03/20/2019, 7:42 AM

## 2019-03-22 LAB — TOXASSURE SELECT 13 (MW), URINE

## 2019-03-28 ENCOUNTER — Encounter: Payer: Self-pay | Admitting: Physician Assistant

## 2019-03-28 ENCOUNTER — Other Ambulatory Visit: Payer: Self-pay | Admitting: Physician Assistant

## 2019-03-29 ENCOUNTER — Other Ambulatory Visit: Payer: Self-pay | Admitting: Physician Assistant

## 2019-03-29 DIAGNOSIS — F902 Attention-deficit hyperactivity disorder, combined type: Secondary | ICD-10-CM

## 2019-03-29 NOTE — Progress Notes (Signed)
[id number] 

## 2019-04-26 ENCOUNTER — Encounter: Payer: Self-pay | Admitting: Physician Assistant

## 2019-04-27 ENCOUNTER — Other Ambulatory Visit: Payer: Self-pay

## 2019-04-27 ENCOUNTER — Ambulatory Visit: Payer: BC Managed Care – PPO | Admitting: Physician Assistant

## 2019-04-27 ENCOUNTER — Encounter: Payer: Self-pay | Admitting: Physician Assistant

## 2019-04-27 VITALS — BP 115/73 | HR 77 | Temp 97.0°F | Ht 61.0 in | Wt 210.2 lb

## 2019-04-27 DIAGNOSIS — N909 Noninflammatory disorder of vulva and perineum, unspecified: Secondary | ICD-10-CM | POA: Diagnosis not present

## 2019-04-27 DIAGNOSIS — B9689 Other specified bacterial agents as the cause of diseases classified elsewhere: Secondary | ICD-10-CM

## 2019-04-27 DIAGNOSIS — N949 Unspecified condition associated with female genital organs and menstrual cycle: Secondary | ICD-10-CM

## 2019-04-27 DIAGNOSIS — L293 Anogenital pruritus, unspecified: Secondary | ICD-10-CM | POA: Diagnosis not present

## 2019-04-27 DIAGNOSIS — N760 Acute vaginitis: Secondary | ICD-10-CM

## 2019-04-27 LAB — WET PREP FOR TRICH, YEAST, CLUE
Clue Cell Exam: POSITIVE — AB
Trichomonas Exam: NEGATIVE
Yeast Exam: NEGATIVE

## 2019-04-27 MED ORDER — CLINDAMYCIN HCL 300 MG PO CAPS: 300.0000 mg | ORAL_CAPSULE | Freq: Two times a day (BID) | ORAL | 0 refills | Status: DC

## 2019-04-27 MED ORDER — BETAMETHASONE VALERATE 0.1 % EX OINT: 1.0000 "application " | TOPICAL_OINTMENT | Freq: Two times a day (BID) | CUTANEOUS | 0 refills | Status: DC

## 2019-04-27 NOTE — Progress Notes (Signed)
BP 115/73   Pulse 77   Temp (!) 97 F (36.1 C) (Oral)   Ht 5\' 1"  (1.549 m)   Wt 210 lb 3.2 oz (95.3 kg)   BMI 39.72 kg/m    Subjective:    Patient ID: Erica Galloway, female    DOB: [birth year], 48 y.o.   MRN: [id number]  HPI: Erica Galloway is a 48 y.o. female presenting on 04/27/2019 for Vaginal Itching  This patient comes in with a complaint of vulvar lesions and itching.  She states greater than 6 months ago she had 1 small area that was present and irritating her.  And it will cause some itching.  She had exacerbations when she used different soaps and when she was wearing sanitary napkins.  So she is tried to go back to only using gentle detergents and no pads.  She has been wearing cotton underwear.  However in the last couple weeks she has had a great increase in the amount of irritation.  She denies any significant discharge or odor.  And the area is bothering her all the way back to the anus.  Past Medical History:  Diagnosis Date  . Allergy   . Arthritis   . Carpal tunnel syndrome, bilateral    Relevant past medical, surgical, family and social history reviewed and updated as indicated. Interim medical history since our last visit reviewed. Allergies and medications reviewed and updated. DATA REVIEWED: CHART IN EPIC  Family History reviewed for pertinent findings.  Review of Systems  Constitutional: Negative.   HENT: Negative.   Eyes: Negative.   Respiratory: Negative.   Gastrointestinal: Negative.   Genitourinary: Positive for genital sores and vaginal pain. Negative for dyspareunia, dysuria, enuresis, pelvic pain, urgency and vaginal discharge.    Allergies as of 04/27/2019      Reactions   Sulfur       Medication List       Accurate as of April 27, 2019 12:26 PM. If you have any questions, ask your nurse or doctor.        betamethasone valerate ointment 0.1 % Commonly known as: VALISONE Apply 1 application topically 2 (two) times daily. Started by:  Terald Sleeper, PA-C   buPROPion 300 MG 24 hr tablet Commonly known as: WELLBUTRIN XL Take 1 tablet (300 mg total) by mouth daily.   cetirizine 10 MG tablet Commonly known as: ZYRTEC Take 10 mg by mouth daily.   clindamycin 300 MG capsule Commonly known as: Cleocin Take 1 capsule (300 mg total) by mouth 2 (two) times a day. Started by: Terald Sleeper, PA-C   clobetasol cream 0.05 % Commonly known as: TEMOVATE Apply 1 application topically 2 (two) times daily.   fluticasone 50 MCG/ACT nasal spray Commonly known as: FLONASE Place into both nostrils daily as needed for allergies or rhinitis.   methylphenidate 54 MG CR tablet Commonly known as: CONCERTA Take 1 tablet (54 mg total) by mouth every morning.   methylphenidate 54 MG CR tablet Commonly known as: CONCERTA Take 1 tablet (54 mg total) by mouth every morning.   methylphenidate 54 MG CR tablet Commonly known as: CONCERTA Take 1 tablet (54 mg total) by mouth every morning.   omeprazole 20 MG capsule Commonly known as: PRILOSEC Take 1 capsule (20 mg total) by mouth daily.   SUMAtriptan 50 MG tablet Commonly known as: Imitrex Take 1 tablet (50 mg total) by mouth every 2 (two) hours as needed for migraine. May repeat in 2  hours if headache persists or recurs.   vitamin C 1000 MG tablet Take 1,000 mg by mouth daily.          Objective:    BP 115/73   Pulse 77   Temp (!) 97 F (36.1 C) (Oral)   Ht 5\' 1"  (1.549 m)   Wt 210 lb 3.2 oz (95.3 kg)   BMI 39.72 kg/m   Allergies  Allergen Reactions  . Sulfur     Wt Readings from Last 3 Encounters:  04/27/19 210 lb 3.2 oz (95.3 kg)  03/16/19 212 lb (96.2 kg)  12/15/18 232 lb (105.2 kg)    Physical Exam Constitutional:      Appearance: She is well-developed.  HENT:     Head: Normocephalic and atraumatic.  Eyes:     Conjunctiva/sclera: Conjunctivae normal.     Pupils: Pupils are equal, round, and reactive to light.  Cardiovascular:     Rate and Rhythm:  Normal rate and regular rhythm.  Abdominal:     General: Bowel sounds are normal.  Genitourinary:      Comments: Yellow above shows areas of excoriation on the perineum and labia all the way to the anus.  She has several small punctate lesions on the superficial area largest being at the bottom of the right labia it is firm to the touch, there is no drainage.  There are no other lesions seen inside the perineum Skin:    General: Skin is warm and dry.     Findings: No rash.  Neurological:     Mental Status: She is alert and oriented to person, place, and time.     Deep Tendon Reflexes: Reflexes are normal and symmetric.  Psychiatric:        Behavior: Behavior normal.        Thought Content: Thought content normal.        Judgment: Judgment normal.     Results for orders placed or performed in visit on 03/16/19  ToxASSURE Select 13 (MW), Urine  Result Value Ref Range   Summary FINAL       Assessment & Plan:   1. Perineal discomfort in female - WET PREP FOR TRICH, YEAST, CLUE  2. Perineal irritation - WET PREP FOR TRICH, YEAST, CLUE  3. Vulvar disease - betamethasone valerate ointment (VALISONE) 0.1 %; Apply 1 application topically 2 (two) times daily.  Dispense: 30 g; Refill: 0  4. BV (bacterial vaginosis) - clindamycin (CLEOCIN) 300 MG capsule; Take 1 capsule (300 mg total) by mouth 2 (two) times a day.  Dispense: 14 capsule; Refill: 0   Continue all other maintenance medications as listed above.  Follow up plan: No follow-ups on file.  Educational handout given for BV  Remus LofflerAngel S. Daryn Pisani PA-C [facility] [address]  [place], [place] [id number] [phone]   04/27/2019, 12:26 PM

## 2019-04-27 NOTE — Patient Instructions (Signed)

## 2019-05-01 ENCOUNTER — Telehealth: Payer: Self-pay | Admitting: *Deleted

## 2019-05-01 ENCOUNTER — Other Ambulatory Visit: Payer: Self-pay | Admitting: Physician Assistant

## 2019-05-01 MED ORDER — TRIAMCINOLONE ACETONIDE 0.5 % EX OINT: 1.0000 "application " | TOPICAL_OINTMENT | Freq: Two times a day (BID) | CUTANEOUS | 0 refills | Status: DC

## 2019-05-01 NOTE — Progress Notes (Signed)
bet

## 2019-05-01 NOTE — Telephone Encounter (Signed)
I sent a prescription for triamcinolone ointment to the pharmacy, we can see if that goes through.

## 2019-05-01 NOTE — Telephone Encounter (Signed)
Insurance Denied Betamethasone Valerate 0.1% Ointment  Please send alternate or I can try PA

## 2019-05-06 ENCOUNTER — Encounter: Payer: Self-pay | Admitting: Physician Assistant

## 2019-05-14 ENCOUNTER — Encounter: Payer: Self-pay | Admitting: Physician Assistant

## 2019-05-15 ENCOUNTER — Other Ambulatory Visit: Payer: Self-pay | Admitting: Physician Assistant

## 2019-05-15 MED ORDER — BUSPIRONE HCL 10 MG PO TABS: 10.0000 mg | ORAL_TABLET | Freq: Three times a day (TID) | ORAL | 5 refills | Status: DC

## 2019-05-15 MED ORDER — ALBUTEROL SULFATE HFA 108 (90 BASE) MCG/ACT IN AERS: 2.0000 | INHALATION_SPRAY | Freq: Four times a day (QID) | RESPIRATORY_TRACT | 2 refills | Status: DC | PRN

## 2019-05-25 ENCOUNTER — Other Ambulatory Visit: Payer: Self-pay

## 2019-05-28 ENCOUNTER — Ambulatory Visit (INDEPENDENT_AMBULATORY_CARE_PROVIDER_SITE_OTHER): Payer: BC Managed Care – PPO | Admitting: Physician Assistant

## 2019-05-28 ENCOUNTER — Encounter: Payer: Self-pay | Admitting: Physician Assistant

## 2019-05-28 VITALS — BP 106/74 | HR 88 | Temp 98.0°F | Ht 61.0 in | Wt 209.0 lb

## 2019-05-28 DIAGNOSIS — J4520 Mild intermittent asthma, uncomplicated: Secondary | ICD-10-CM | POA: Diagnosis not present

## 2019-05-28 DIAGNOSIS — Z01419 Encounter for gynecological examination (general) (routine) without abnormal findings: Secondary | ICD-10-CM

## 2019-05-28 DIAGNOSIS — Z01411 Encounter for gynecological examination (general) (routine) with abnormal findings: Secondary | ICD-10-CM

## 2019-05-28 DIAGNOSIS — R87610 Atypical squamous cells of undetermined significance on cytologic smear of cervix (ASC-US): Secondary | ICD-10-CM | POA: Diagnosis not present

## 2019-05-28 NOTE — Patient Instructions (Signed)
Health Maintenance, Female Adopting a healthy lifestyle and getting preventive care are important in promoting health and wellness. Ask your health care provider about:  The right schedule for you to have regular tests and exams.  Things you can do on your own to prevent diseases and keep yourself healthy. What should I know about diet, weight, and exercise? Eat a healthy diet   Eat a diet that includes plenty of vegetables, fruits, low-fat dairy products, and lean protein.  Do not eat a lot of foods that are high in solid fats, added sugars, or sodium. Maintain a healthy weight Body mass index (BMI) is used to identify weight problems. It estimates body fat based on height and weight. Your health care provider can help determine your BMI and help you achieve or maintain a healthy weight. Get regular exercise Get regular exercise. This is one of the most important things you can do for your health. Most adults should:  Exercise for at least 150 minutes each week. The exercise should increase your heart rate and make you sweat (moderate-intensity exercise).  Do strengthening exercises at least twice a week. This is in addition to the moderate-intensity exercise.  Spend less time sitting. Even light physical activity can be beneficial. Watch cholesterol and blood lipids Have your blood tested for lipids and cholesterol at 50 years of age, then have this test every 5 years. Have your cholesterol levels checked more often if:  Your lipid or cholesterol levels are high.  You are older than 50 years of age.  You are at high risk for heart disease. What should I know about cancer screening? Depending on your health history and family history, you may need to have cancer screening at various ages. This may include screening for:  Breast cancer.  Cervical cancer.  Colorectal cancer.  Skin cancer.  Lung cancer. What should I know about heart disease, diabetes, and high blood  pressure? Blood pressure and heart disease  High blood pressure causes heart disease and increases the risk of stroke. This is more likely to develop in people who have high blood pressure readings, are of African descent, or are overweight.  Have your blood pressure checked: ? Every 3-5 years if you are 18-39 years of age. ? Every year if you are 40 years old or older. Diabetes Have regular diabetes screenings. This checks your fasting blood sugar level. Have the screening done:  Once every three years after age 40 if you are at a normal weight and have a low risk for diabetes.  More often and at a younger age if you are overweight or have a high risk for diabetes. What should I know about preventing infection? Hepatitis B If you have a higher risk for hepatitis B, you should be screened for this virus. Talk with your health care provider to find out if you are at risk for hepatitis B infection. Hepatitis C Testing is recommended for:  Everyone born from 1945 through 1965.  Anyone with known risk factors for hepatitis C. Sexually transmitted infections (STIs)  Get screened for STIs, including gonorrhea and chlamydia, if: ? You are sexually active and are younger than 50 years of age. ? You are older than 50 years of age and your health care provider tells you that you are at risk for this type of infection. ? Your sexual activity has changed since you were last screened, and you are at increased risk for chlamydia or gonorrhea. Ask your health care provider if   you are at risk.  Ask your health care provider about whether you are at high risk for HIV. Your health care provider may recommend a prescription medicine to help prevent HIV infection. If you choose to take medicine to prevent HIV, you should first get tested for HIV. You should then be tested every 3 months for as long as you are taking the medicine. Pregnancy  If you are about to stop having your period (premenopausal) and  you may become pregnant, seek counseling before you get pregnant.  Take 400 to 800 micrograms (mcg) of folic acid every day if you become pregnant.  Ask for birth control (contraception) if you want to prevent pregnancy. Osteoporosis and menopause Osteoporosis is a disease in which the bones lose minerals and strength with aging. This can result in bone fractures. If you are 65 years old or older, or if you are at risk for osteoporosis and fractures, ask your health care provider if you should:  Be screened for bone loss.  Take a calcium or vitamin D supplement to lower your risk of fractures.  Be given hormone replacement therapy (HRT) to treat symptoms of menopause. Follow these instructions at home: Lifestyle  Do not use any products that contain nicotine or tobacco, such as cigarettes, e-cigarettes, and chewing tobacco. If you need help quitting, ask your health care provider.  Do not use street drugs.  Do not share needles.  Ask your health care provider for help if you need support or information about quitting drugs. Alcohol use  Do not drink alcohol if: ? Your health care provider tells you not to drink. ? You are pregnant, may be pregnant, or are planning to become pregnant.  If you drink alcohol: ? Limit how much you use to 0-1 drink a day. ? Limit intake if you are breastfeeding.  Be aware of how much alcohol is in your drink. In the [place] one drink equals one 12 oz bottle of beer (355 mL), one 5 oz glass of wine (148 mL), or one 1 oz glass of hard liquor (44 mL). General instructions  Schedule regular health, dental, and eye exams.  Stay current with your vaccines.  Tell your health care provider if: ? You often feel depressed. ? You have ever been abused or do not feel safe at home. Summary  Adopting a healthy lifestyle and getting preventive care are important in promoting health and wellness.  Follow your health care provider's instructions about healthy  diet, exercising, and getting tested or screened for diseases.  Follow your health care provider's instructions on monitoring your cholesterol and blood pressure. This information is not intended to replace advice given to you by your health care provider. Make sure you discuss any questions you have with your health care provider. Document Released: 04/12/2011 Document Revised: 09/20/2018 Document Reviewed: 09/20/2018 Elsevier Patient Education  2020 [employer] Inc[employer]  

## 2019-05-28 NOTE — Progress Notes (Signed)
BP 106/74   Pulse 88   Temp 98 F (36.7 C) (Temporal)   Ht 5\' 1"  (1.549 m)   Wt 209 lb (94.8 kg)   BMI 39.49 kg/m    Subjective:    Patient ID: Erica HopesKathy F Leisey, female    DOB: 1971-07-13, 48 y.o.   MRN: [id number]  HPI: Erica Galloway is a 48 y.o. female presenting on 05/28/2019 for Gynecologic Exam  This patient comes in for general exam that medical and Pap.  Is been a few years since having.  She states she has never had any abnormal Pap smears in the past.  That her cycle is still fairly normal.  She does have 1 every month.  They are getting lighter.  She describes no menopausal symptoms at this time  She does have asthma and has had flareups when it is very hot.  It does bother her when she is in her work and having to wear 1 for a long period of time.  She does get short of breath.  I have encouraged her trying to use it as little as possible and we have sent a note to her work to try to have her use it as little as possible.  Only when she is doing very close proximity to other work-ups.  Past Medical History:  Diagnosis Date  . Allergy   . Arthritis   . Carpal tunnel syndrome, bilateral    Relevant past medical, surgical, family and social history reviewed and updated as indicated. Interim medical history since our last visit reviewed. Allergies and medications reviewed and updated. DATA REVIEWED: CHART IN EPIC  Family History reviewed for pertinent findings.  Review of Systems  Constitutional: Negative.  Negative for activity change, fatigue and fever.  HENT: Negative.   Eyes: Negative.   Respiratory: Positive for shortness of breath and wheezing. Negative for cough.   Cardiovascular: Negative.  Negative for chest pain.  Gastrointestinal: Negative.  Negative for abdominal pain.  Endocrine: Negative.   Genitourinary: Negative.  Negative for dysuria.  Musculoskeletal: Negative.   Skin: Negative.   Neurological: Negative.     Allergies as of 05/28/2019    Reactions   Sulfur       Medication List       Accurate as of May 28, 2019 11:59 PM. If you have any questions, ask your nurse or doctor.        STOP taking these medications   clindamycin 300 MG capsule Commonly known as: Cleocin Stopped by: Remus LofflerAngel S Louann Hopson, PA-C     TAKE these medications   albuterol 108 (90 Base) MCG/ACT inhaler Commonly known as: VENTOLIN HFA Inhale 2 puffs into the lungs every 6 (six) hours as needed for wheezing or shortness of breath.   betamethasone valerate ointment 0.1 % Commonly known as: VALISONE Apply 1 application topically 2 (two) times daily.   buPROPion 300 MG 24 hr tablet Commonly known as: WELLBUTRIN XL Take 1 tablet (300 mg total) by mouth daily.   busPIRone 10 MG tablet Commonly known as: BUSPAR Take 1 tablet (10 mg total) by mouth 3 (three) times daily. Start with one daily for 3 days, then twice daily, then TID   cetirizine 10 MG tablet Commonly known as: ZYRTEC Take 10 mg by mouth daily.   clobetasol cream 0.05 % Commonly known as: TEMOVATE Apply 1 application topically 2 (two) times daily.   fluticasone 50 MCG/ACT nasal spray Commonly known as: FLONASE Place into both nostrils daily  as needed for allergies or rhinitis.   methylphenidate 54 MG CR tablet Commonly known as: CONCERTA Take 1 tablet (54 mg total) by mouth every morning.   methylphenidate 54 MG CR tablet Commonly known as: CONCERTA Take 1 tablet (54 mg total) by mouth every morning.   methylphenidate 54 MG CR tablet Commonly known as: CONCERTA Take 1 tablet (54 mg total) by mouth every morning.   omeprazole 20 MG capsule Commonly known as: PRILOSEC Take 1 capsule (20 mg total) by mouth daily.   SUMAtriptan 50 MG tablet Commonly known as: Imitrex Take 1 tablet (50 mg total) by mouth every 2 (two) hours as needed for migraine. May repeat in 2 hours if headache persists or recurs.   triamcinolone ointment 0.5 % Commonly known as: KENALOG Apply 1  application topically 2 (two) times daily.   vitamin C 1000 MG tablet Take 1,000 mg by mouth daily.          Objective:    BP 106/74   Pulse 88   Temp 98 F (36.7 C) (Temporal)   Ht 5\' 1"  (1.549 m)   Wt 209 lb (94.8 kg)   BMI 39.49 kg/m   Allergies  Allergen Reactions  . Sulfur     Wt Readings from Last 3 Encounters:  05/28/19 209 lb (94.8 kg)  04/27/19 210 lb 3.2 oz (95.3 kg)  03/16/19 212 lb (96.2 kg)    Physical Exam Constitutional:      Appearance: She is well-developed.  HENT:     Head: Normocephalic and atraumatic.  Eyes:     Conjunctiva/sclera: Conjunctivae normal.     Pupils: Pupils are equal, round, and reactive to light.  Neck:     Musculoskeletal: Normal range of motion and neck supple.  Cardiovascular:     Rate and Rhythm: Normal rate and regular rhythm.     Heart sounds: Normal heart sounds.  Pulmonary:     Effort: Pulmonary effort is normal.     Breath sounds: Wheezing present.  Chest:     Breasts: Breasts are symmetrical.        Right: No mass, skin change or tenderness.        Left: No mass, skin change or tenderness.  Abdominal:     General: Bowel sounds are normal.     Palpations: Abdomen is soft.  Genitourinary:    Labia:        Right: No tenderness or lesion.        Left: No tenderness or lesion.      Vagina: Normal. No vaginal discharge, tenderness or bleeding.     Cervix: No cervical motion tenderness, discharge or friability.     Uterus: Not deviated, not enlarged and not tender.      Adnexa:        Right: No mass, tenderness or fullness.         Left: No mass, tenderness or fullness.       Rectum: No anal fissure.  Skin:    General: Skin is warm and dry.     Findings: No rash.  Neurological:     Mental Status: She is alert and oriented to person, place, and time.     Deep Tendon Reflexes: Reflexes are normal and symmetric.  Psychiatric:        Behavior: Behavior normal.        Thought Content: Thought content normal.         Judgment: Judgment normal.     Results for  orders placed or performed in visit on 04/27/19  WET PREP FOR TRICH, YEAST, CLUE   Specimen: Vaginal Fluid   VAGINAL FLUI  Result Value Ref Range   Trichomonas Exam Negative Negative   Yeast Exam Negative Negative   Clue Cell Exam Positive (A) Negative      Assessment & Plan:   1. Well female exam with routine gynecological exam - IGP, Aptima HPV, rfx 16/18,45  2. Mild intermittent asthma, unspecified whether complicated *Continue albuterol on an as-needed basis Use mask as little as possible    Continue all other maintenance medications as listed above.  Follow up plan: 1 year for annual exam  Educational handout given for health maintenance  Remus LofflerAngel S. Lealer Marsland PA-C [facility] [address]  [place], [place] [id number] [phone]   05/29/2019, 10:00 PM

## 2019-05-31 ENCOUNTER — Encounter: Payer: Self-pay | Admitting: Physician Assistant

## 2019-05-31 LAB — IGP, APTIMA HPV, RFX 16/18,45: HPV Aptima: NEGATIVE

## 2019-06-04 ENCOUNTER — Encounter: Payer: Self-pay | Admitting: Physician Assistant

## 2019-06-08 ENCOUNTER — Other Ambulatory Visit: Payer: Self-pay

## 2019-06-11 ENCOUNTER — Encounter: Payer: Self-pay | Admitting: Physician Assistant

## 2019-06-11 ENCOUNTER — Other Ambulatory Visit: Payer: Self-pay

## 2019-06-11 ENCOUNTER — Ambulatory Visit: Payer: BC Managed Care – PPO | Admitting: Physician Assistant

## 2019-06-11 DIAGNOSIS — F902 Attention-deficit hyperactivity disorder, combined type: Secondary | ICD-10-CM

## 2019-06-11 MED ORDER — METHYLPHENIDATE HCL ER (OSM) 54 MG PO TBCR: 54.0000 mg | EXTENDED_RELEASE_TABLET | ORAL | 0 refills | Status: DC

## 2019-06-11 NOTE — Progress Notes (Signed)
BP 101/70   Pulse 80   Temp (!) 97.3 F (36.3 C) (Temporal)   Ht 5\' 1"  (1.549 m)   Wt 207 lb 6.4 oz (94.1 kg)   BMI 39.19 kg/m    Subjective:    Patient ID: Erica Galloway, female    DOB: 05/08/1971, 48 y.o.   MRN: [id number]  HPI: Erica Galloway is a 48 y.o. female presenting on 06/11/2019 for ADHD  This patient returns for a 3 month recheck on ADD/ADHD and medication refills  Currently taking concerta 54 mg one daily. Behavior- normal Medication side effects- no Weight loss- no Sleeping habits- normal Any concerns- no  PDMP website reviewed: Yes Any suspicious activity; No UDS: lab drawn today Contract on file: Yes  06/13/18   Past Medical History:  Diagnosis Date  . Allergy   . Arthritis   . Carpal tunnel syndrome, bilateral    Relevant past medical, surgical, family and social history reviewed and updated as indicated. Interim medical history since our last visit reviewed. Allergies and medications reviewed and updated. DATA REVIEWED: CHART IN EPIC  Family History reviewed for pertinent findings.  Review of Systems  Constitutional: Negative.   HENT: Negative.   Eyes: Negative.   Respiratory: Negative.   Gastrointestinal: Negative.   Genitourinary: Negative.     Allergies as of 06/11/2019      Reactions   Sulfur       Medication List       Accurate as of June 11, 2019 10:26 PM. If you have any questions, ask your nurse or doctor.        albuterol 108 (90 Base) MCG/ACT inhaler Commonly known as: VENTOLIN HFA Inhale 2 puffs into the lungs every 6 (six) hours as needed for wheezing or shortness of breath.   betamethasone valerate ointment 0.1 % Commonly known as: VALISONE Apply 1 application topically 2 (two) times daily.   buPROPion 300 MG 24 hr tablet Commonly known as: WELLBUTRIN XL Take 1 tablet (300 mg total) by mouth daily.   busPIRone 10 MG tablet Commonly known as: BUSPAR Take 1 tablet (10 mg total) by mouth 3 (three) times daily.  Start with one daily for 3 days, then twice daily, then TID   cetirizine 10 MG tablet Commonly known as: ZYRTEC Take 10 mg by mouth daily.   clobetasol cream 0.05 % Commonly known as: TEMOVATE Apply 1 application topically 2 (two) times daily.   fluticasone 50 MCG/ACT nasal spray Commonly known as: FLONASE Place into both nostrils daily as needed for allergies or rhinitis.   methylphenidate 54 MG CR tablet Commonly known as: CONCERTA Take 1 tablet (54 mg total) by mouth every morning.   methylphenidate 54 MG CR tablet Commonly known as: CONCERTA Take 1 tablet (54 mg total) by mouth every morning.   methylphenidate 54 MG CR tablet Commonly known as: CONCERTA Take 1 tablet (54 mg total) by mouth every morning.   omeprazole 20 MG capsule Commonly known as: PRILOSEC Take 1 capsule (20 mg total) by mouth daily.   SUMAtriptan 50 MG tablet Commonly known as: Imitrex Take 1 tablet (50 mg total) by mouth every 2 (two) hours as needed for migraine. May repeat in 2 hours if headache persists or recurs.   triamcinolone ointment 0.5 % Commonly known as: KENALOG Apply 1 application topically 2 (two) times daily.   vitamin C 1000 MG tablet Take 1,000 mg by mouth daily.          Objective:  BP 101/70   Pulse 80   Temp (!) 97.3 F (36.3 C) (Temporal)   Ht 5\' 1"  (1.549 m)   Wt 207 lb 6.4 oz (94.1 kg)   BMI 39.19 kg/m   Allergies  Allergen Reactions  . Sulfur     Wt Readings from Last 3 Encounters:  06/11/19 207 lb 6.4 oz (94.1 kg)  05/28/19 209 lb (94.8 kg)  04/27/19 210 lb 3.2 oz (95.3 kg)    Physical Exam Constitutional:      General: She is not in acute distress.    Appearance: Normal appearance. She is well-developed.  HENT:     Head: Normocephalic and atraumatic.  Cardiovascular:     Rate and Rhythm: Normal rate.  Pulmonary:     Effort: Pulmonary effort is normal.  Skin:    General: Skin is warm and dry.     Findings: No rash.  Neurological:      Mental Status: She is alert and oriented to person, place, and time.     Deep Tendon Reflexes: Reflexes are normal and symmetric.         Assessment & Plan:   1. Attention deficit hyperactivity disorder (ADHD), combined type - methylphenidate 54 MG PO CR tablet; Take 1 tablet (54 mg total) by mouth every morning.  Dispense: 30 tablet; Refill: 0 - methylphenidate 54 MG PO CR tablet; Take 1 tablet (54 mg total) by mouth every morning.  Dispense: 30 tablet; Refill: 0 - methylphenidate 54 MG PO CR tablet; Take 1 tablet (54 mg total) by mouth every morning.  Dispense: 30 tablet; Refill: 0 - Methylphenidate, Serum   Continue all other maintenance medications as listed above.  Follow up plan: Return in about 3 months (around 09/10/2019).  Educational handout given for [place] PA-C [facility] [address]  [place], [place] [address] [phone]   06/11/2019, 10:26 PM

## 2019-06-13 ENCOUNTER — Ambulatory Visit: Payer: BC Managed Care – PPO | Admitting: Physician Assistant

## 2019-06-13 LAB — METHYLPHENIDATE, SERUM: Methylphenidate: 8.3 ng/mL (ref 5.0–20.0)

## 2019-06-24 ENCOUNTER — Encounter: Payer: Self-pay | Admitting: Physician Assistant

## 2019-07-05 ENCOUNTER — Encounter: Payer: Self-pay | Admitting: Physician Assistant

## 2019-07-24 ENCOUNTER — Ambulatory Visit (INDEPENDENT_AMBULATORY_CARE_PROVIDER_SITE_OTHER): Payer: BC Managed Care – PPO

## 2019-07-24 ENCOUNTER — Other Ambulatory Visit: Payer: Self-pay

## 2019-07-24 DIAGNOSIS — Z23 Encounter for immunization: Secondary | ICD-10-CM | POA: Diagnosis not present

## 2019-08-06 ENCOUNTER — Other Ambulatory Visit: Payer: Self-pay | Admitting: Physician Assistant

## 2019-08-07 ENCOUNTER — Encounter: Payer: Self-pay | Admitting: Physician Assistant

## 2019-08-08 ENCOUNTER — Encounter: Payer: Self-pay | Admitting: Family Medicine

## 2019-08-08 ENCOUNTER — Ambulatory Visit: Payer: BC Managed Care – PPO | Admitting: Family Medicine

## 2019-08-08 ENCOUNTER — Other Ambulatory Visit: Payer: BC Managed Care – PPO

## 2019-08-08 ENCOUNTER — Ambulatory Visit (INDEPENDENT_AMBULATORY_CARE_PROVIDER_SITE_OTHER): Payer: BC Managed Care – PPO

## 2019-08-08 ENCOUNTER — Other Ambulatory Visit: Payer: Self-pay

## 2019-08-08 VITALS — BP 99/66 | HR 79 | Temp 97.5°F | Ht 61.0 in | Wt 210.0 lb

## 2019-08-08 DIAGNOSIS — M25561 Pain in right knee: Secondary | ICD-10-CM | POA: Diagnosis not present

## 2019-08-08 DIAGNOSIS — S8991XA Unspecified injury of right lower leg, initial encounter: Secondary | ICD-10-CM | POA: Diagnosis not present

## 2019-08-08 MED ORDER — MELOXICAM 15 MG PO TABS: 7.5000 mg | ORAL_TABLET | Freq: Every day | ORAL | 0 refills | Status: DC | PRN

## 2019-08-08 NOTE — Patient Instructions (Signed)
No fractures.  Likely a bony contusion.  Recommend ice, elevation and knee sleeve as we discussed.  You have prescribed a nonsteroidal anti-inflammatory drug (NSAID) today. This will help with your pain and inflammation. Please do not take any other NSAIDs (ibuprofen/Motrin/Advil, naproxen/Aleve, meloxicam/Mobic, Voltaren/diclofenac). Please make sure to eat a meal when taking this medication.   Caution:  If you have a history of acid reflux/indigestion, I recommend that you take an antacid (such as Prilosec, Prevacid) daily while on the NSAID.  If you have a history of bleeding disorder, gastric ulcer, are on a blood thinner (like warfarin/Coumadin, Xarelto, Eliquis, etc) please do not take NSAID.  If you have ever had a heart attack, you should not take NSAIDs.  Contusion A contusion is a deep bruise. This is a result of an injury that causes bleeding under the skin. Symptoms of bruising include pain, swelling, and discolored skin. The skin may turn blue, purple, or yellow. Follow these instructions at home: Managing pain, stiffness, and swelling You may use RICE. This stands for:  Resting.  Icing.  Compression, or putting pressure.  Elevating, or raising the injured area. To follow this method, do these actions:  Rest the injured area.  If told, put ice on the injured area. ? Put ice in a plastic bag. ? Place a towel between your skin and the bag. ? Leave the ice on for 20 minutes, 2-3 times per day.  If told, put light pressure (compression) on the injured area using an elastic bandage. Make sure the bandage is not too tight. If the area tingles or becomes numb, remove it and put it back on as told by your doctor.  If possible, raise (elevate) the injured area above the level of your heart while you are sitting or lying down.  General instructions  Take over-the-counter and prescription medicines only as told by your doctor.  Keep all follow-up visits as told by your  doctor. This is important. Contact a doctor if:  Your symptoms do not get better after several days of treatment.  Your symptoms get worse.  You have trouble moving the injured area. Get help right away if:  You have very bad pain.  You have a loss of feeling (numbness) in a hand or foot.  Your hand or foot turns pale or cold. Summary  A contusion is a deep bruise. This is a result of an injury that causes bleeding under the skin.  Symptoms of bruising include pain, swelling, and discolored skin. The skin may turn blue, purple, or yellow.  This condition is treated with rest, ice, compression, and elevation. This is also called RICE. You may be given over-the-counter medicines for pain.  Contact a doctor if you do not feel better, or you feel worse. Get help right away if you have very bad pain, have lost feeling in a hand or foot, or the area turns pale or cold. This information is not intended to replace advice given to you by your health care provider. Make sure you discuss any questions you have with your health care provider. Document Released: 03/15/2008 Document Revised: 05/19/2018 Document Reviewed: 05/19/2018 Elsevier Patient Education  2020 [employer].

## 2019-08-08 NOTE — Progress Notes (Signed)
Subjective: CC: knee pain PCP: Terald Sleeper, PA-C HTD:SKAJG Erica Galloway is a 48 y.o. female presenting to clinic today for:  1. Knee pain Patient reports that she sustained a mechanical fall directly onto the right knee about 10 days ago.  She subsequently had pain, swelling.  Denies any locking or popping of the knee.  No instability.  No weakness.  The pain seemed to resolve for a few days but since has been getting intermittently worse.  It is worse with ambulation, going up stairs.  The swelling is also intermittent.  Denies any discoloration.  She has not used any bracing or modified any of her activities.  She continues to work and use ibuprofen intermittently with various levels of improvement.   ROS: Per HPI  Allergies  Allergen Reactions  . Sulfur    Past Medical History:  Diagnosis Date  . Allergy   . Arthritis   . Carpal tunnel syndrome, bilateral     Current Outpatient Medications:  .  albuterol (VENTOLIN HFA) 108 (90 Base) MCG/ACT inhaler, Inhale 2 puffs into the lungs every 6 (six) hours as needed for wheezing or shortness of breath., Disp: 18 g, Rfl: 2 .  Ascorbic Acid (VITAMIN C) 1000 MG tablet, Take 1,000 mg by mouth daily., Disp: , Rfl:  .  betamethasone valerate ointment (VALISONE) 0.1 %, Apply 1 application topically 2 (two) times daily., Disp: 30 g, Rfl: 0 .  buPROPion (WELLBUTRIN XL) 300 MG 24 hr tablet, Take 1 tablet (300 mg total) by mouth daily., Disp: 30 tablet, Rfl: 5 .  busPIRone (BUSPAR) 10 MG tablet, Take 1 tablet (10 mg total) by mouth 3 (three) times daily. Start with one daily for 3 days, then twice daily, then TID, Disp: 90 tablet, Rfl: 5 .  cetirizine (ZYRTEC) 10 MG tablet, Take 10 mg by mouth daily., Disp: , Rfl:  .  clobetasol cream (TEMOVATE) 8.11 %, Apply 1 application topically 2 (two) times daily., Disp: 60 g, Rfl: 2 .  fluticasone (FLONASE) 50 MCG/ACT nasal spray, Place into both nostrils daily as needed for allergies or rhinitis., Disp: ,  Rfl:  .  methylphenidate 54 MG PO CR tablet, Take 1 tablet (54 mg total) by mouth every morning., Disp: 30 tablet, Rfl: 0 .  methylphenidate 54 MG PO CR tablet, Take 1 tablet (54 mg total) by mouth every morning., Disp: 30 tablet, Rfl: 0 .  methylphenidate 54 MG PO CR tablet, Take 1 tablet (54 mg total) by mouth every morning., Disp: 30 tablet, Rfl: 0 .  omeprazole (PRILOSEC) 20 MG capsule, Take 1 capsule (20 mg total) by mouth daily., Disp: 30 capsule, Rfl: 3 .  SUMAtriptan (IMITREX) 50 MG tablet, Take 1 tablet (50 mg total) by mouth every 2 (two) hours as needed for migraine. May repeat in 2 hours if headache persists or recurs., Disp: 10 tablet, Rfl: 0 .  triamcinolone ointment (KENALOG) 0.5 %, Apply 1 application topically 2 (two) times daily., Disp: 30 g, Rfl: 0 Social History   Socioeconomic History  . Marital status: Married    Spouse name: Not on file  . Number of children: Not on file  . Years of education: Not on file  . Highest education level: Not on file  Occupational History  . Not on file  Social Needs  . Financial resource strain: Not on file  . Food insecurity    Worry: Not on file    Inability: Not on file  . Transportation needs  Medical: Not on file    Non-medical: Not on file  Tobacco Use  . Smoking status: Never Smoker  . Smokeless tobacco: Never Used  Substance and Sexual Activity  . Alcohol use: No  . Drug use: No  . Sexual activity: Not on file  Lifestyle  . Physical activity    Days per week: Not on file    Minutes per session: Not on file  . Stress: Not on file  Relationships  . Social Musician on phone: Not on file    Gets together: Not on file    Attends religious service: Not on file    Active member of club or organization: Not on file    Attends meetings of clubs or organizations: Not on file    Relationship status: Not on file  . Intimate partner violence    Fear of current or ex partner: Not on file    Emotionally abused:  Not on file    Physically abused: Not on file    Forced sexual activity: Not on file  Other Topics Concern  . Not on file  Social History Narrative  . Not on file   Family History  Problem Relation Age of Onset  . Cancer Mother        Breast   . Hyperlipidemia Father     Objective: Office vital signs reviewed. BP 99/66   Pulse 79   Temp (!) 97.5 Erica (36.4 C) (Temporal)   Ht 5\' 1"  (1.549 m)   Wt 210 lb (95.3 kg)   BMI 39.68 kg/m   Physical Examination:  General: Awake, alert, obese, No acute distress MSK:  Right knee: She has mild joint effusion noted laterally.  No soft tissue swelling appreciated.  There is a healing excoriation along the anterior knee appreciated.  No tenderness palpation to patella, patellar tendon, quad tendon or joint line.  No posterior popliteal masses or tenderness.  No ligamentous laxity.  She has full active range of motion. Neuro: Light sensation grossly intact  Assessment/ Plan: 48 y.o. female   1. Acute pain of right knee Likely bony contusion.  I did go ahead and obtain x-rays given reports of immediate swelling and pain and upon personal review did not appreciate any fracture or dislocation.  Her physical exam does not support meniscal injury.  I have recommended oral NSAID, ice, brace for swelling and modification of activity as able.  We discussed return precautions and she will follow-up as needed - DG Knee 1-2 Views Right; Future - meloxicam (MOBIC) 15 MG tablet; Take 0.5-1 tablets (7.5-15 mg total) by mouth daily as needed for pain.  Dispense: 30 tablet; Refill: 0   Orders Placed This Encounter  Procedures  . DG Knee 1-2 Views Right    Standing Status:   Future    Number of Occurrences:   1    Standing Expiration Date:   10/07/2020    Order Specific Question:   Reason for Exam (SYMPTOM  OR DIAGNOSIS REQUIRED)    Answer:   fall and pain    Order Specific Question:   Is the patient pregnant?    Answer:   No    Order Specific  Question:   Preferred imaging location?    Answer:   Internal   No orders of the defined types were placed in this encounter.    10/09/2020, DO [facility] [phone]

## 2019-09-04 ENCOUNTER — Encounter: Payer: Self-pay | Admitting: Physician Assistant

## 2019-09-04 ENCOUNTER — Ambulatory Visit (INDEPENDENT_AMBULATORY_CARE_PROVIDER_SITE_OTHER): Payer: BC Managed Care – PPO | Admitting: Physician Assistant

## 2019-09-04 DIAGNOSIS — K3184 Gastroparesis: Secondary | ICD-10-CM

## 2019-09-04 DIAGNOSIS — F902 Attention-deficit hyperactivity disorder, combined type: Secondary | ICD-10-CM | POA: Diagnosis not present

## 2019-09-04 MED ORDER — METOCLOPRAMIDE HCL 10 MG PO TABS: 10.0000 mg | ORAL_TABLET | Freq: Three times a day (TID) | ORAL | 5 refills | Status: DC

## 2019-09-04 MED ORDER — METHYLPHENIDATE HCL ER (OSM) 54 MG PO TBCR: 54.0000 mg | EXTENDED_RELEASE_TABLET | ORAL | 0 refills | Status: DC

## 2019-09-04 MED ORDER — BUPROPION HCL ER (XL) 300 MG PO TB24: 300.0000 mg | ORAL_TABLET | Freq: Every day | ORAL | 3 refills | Status: DC

## 2019-09-04 NOTE — Progress Notes (Signed)
Telephone visit  Subjective: CC:ADD and gastroparesis PCP: Terald Sleeper, PA-C QZR:[id number] Erica Galloway is a 48 y.o. female calls for telephone consult today. Patient provides verbal consent for consult held via phone.  Patient is identified with 2 separate identifiers.  At this time the entire area is on COVID-19 social distancing and stay home orders are in place.  Patient is of higher risk and therefore we are performing this by a virtual method.  Location of patient: home Location of provider: HOME Others present for call: no  .This patient is having difficulty with her gastroparesis.  She had really been trying to have diet people to control it for at all possible.  But nothing seems to be helping.  She has been eating a proper diet and eating in proper shifts.  She also has been taking a probiotic.  But she cannot tell a difference.  Years ago she did take Propulsid, which is off the market.  She also has taken metoclopramide for her gastroparesis.  She was diagnosed with this at age 44.  This patient returns for a 3 month recheck on ADD/ADHD and medication refills  Currently taking concerta 54 mg one daily. Behavior- normal Medication side effects- no Weight loss- no Sleeping habits- normal Any concerns- no  PDMP website reviewed: Yes Any suspicious activity; No UDS: 06/11/19 Contract on file: Yes  06/13/18  ROS: Per HPI  Allergies  Allergen Reactions  . Sulfur    Past Medical History:  Diagnosis Date  . Allergy   . Arthritis   . Carpal tunnel syndrome, bilateral     Current Outpatient Medications:  .  albuterol (VENTOLIN HFA) 108 (90 Base) MCG/ACT inhaler, Inhale 2 puffs into the lungs every 6 (six) hours as needed for wheezing or shortness of breath., Disp: 18 g, Rfl: 2 .  Ascorbic Acid (VITAMIN C) 1000 MG tablet, Take 1,000 mg by mouth daily., Disp: , Rfl:  .  betamethasone valerate ointment (VALISONE) 0.1 %, Apply 1 application topically 2 (two)  times daily., Disp: 30 g, Rfl: 0 .  buPROPion (WELLBUTRIN XL) 300 MG 24 hr tablet, Take 1 tablet (300 mg total) by mouth daily., Disp: 90 tablet, Rfl: 3 .  busPIRone (BUSPAR) 10 MG tablet, Take 1 tablet (10 mg total) by mouth 3 (three) times daily. Start with one daily for 3 days, then twice daily, then TID, Disp: 90 tablet, Rfl: 5 .  cetirizine (ZYRTEC) 10 MG tablet, Take 10 mg by mouth daily., Disp: , Rfl:  .  clobetasol cream (TEMOVATE) 2.63 %, Apply 1 application topically 2 (two) times daily., Disp: 60 g, Rfl: 2 .  fluticasone (FLONASE) 50 MCG/ACT nasal spray, Place into both nostrils daily as needed for allergies or rhinitis., Disp: , Rfl:  .  meloxicam (MOBIC) 15 MG tablet, Take 0.5-1 tablets (7.5-15 mg total) by mouth daily as needed for pain., Disp: 30 tablet, Rfl: 0 .  methylphenidate 54 MG PO CR tablet, Take 1 tablet (54 mg total) by mouth every morning., Disp: 30 tablet, Rfl: 0 .  methylphenidate 54 MG PO CR tablet, Take 1 tablet (54 mg total) by mouth every morning., Disp: 30 tablet, Rfl: 0 .  methylphenidate 54 MG PO CR tablet, Take 1 tablet (54 mg total) by mouth every morning., Disp: 30 tablet, Rfl: 0 .  metoCLOPramide (REGLAN) 10 MG tablet, Take 1 tablet (10 mg total) by mouth 4 (four) times daily -  before meals and at bedtime.,  Disp: 120 tablet, Rfl: 5 .  SUMAtriptan (IMITREX) 50 MG tablet, Take 1 tablet (50 mg total) by mouth every 2 (two) hours as needed for migraine. May repeat in 2 hours if headache persists or recurs. (Patient not taking: Reported on 08/08/2019), Disp: 10 tablet, Rfl: 0 .  triamcinolone ointment (KENALOG) 0.5 %, Apply 1 application topically 2 (two) times daily., Disp: 30 g, Rfl: 0  Assessment/ Plan: 48 y.o. female   1. Attention deficit hyperactivity disorder (ADHD), combined type - methylphenidate 54 MG PO CR tablet; Take 1 tablet (54 mg total) by mouth every morning.  Dispense: 30 tablet; Refill: 0 - methylphenidate 54 MG PO CR tablet; Take 1 tablet (54  mg total) by mouth every morning.  Dispense: 30 tablet; Refill: 0 - methylphenidate 54 MG PO CR tablet; Take 1 tablet (54 mg total) by mouth every morning.  Dispense: 30 tablet; Refill: 0 - buPROPion (WELLBUTRIN XL) 300 MG 24 hr tablet; Take 1 tablet (300 mg total) by mouth daily.  Dispense: 90 tablet; Refill: 3  2. Gastroparesis - metoCLOPramide (REGLAN) 10 MG tablet; Take 1 tablet (10 mg total) by mouth 4 (four) times daily -  before meals and at bedtime.  Dispense: 120 tablet; Refill: 5   Return in about 3 months (around 12/05/2019).  Continue all other maintenance medications as listed above.  Start time: 4:15 pm  End time: 4:28 PM  Meds ordered this encounter  Medications  . methylphenidate 54 MG PO CR tablet    Sig: Take 1 tablet (54 mg total) by mouth every morning.    Dispense:  30 tablet    Refill:  0    Fill 30 days from original script date    Order Specific Question:   Supervising Provider    Answer:   Raliegh Ip [[id number]]  . methylphenidate 54 MG PO CR tablet    Sig: Take 1 tablet (54 mg total) by mouth every morning.    Dispense:  30 tablet    Refill:  0    Fill 60 days from original script date    Order Specific Question:   Supervising Provider    Answer:   Raliegh Ip [[id number]]  . methylphenidate 54 MG PO CR tablet    Sig: Take 1 tablet (54 mg total) by mouth every morning.    Dispense:  30 tablet    Refill:  0    Order Specific Question:   Supervising Provider    Answer:   Raliegh Ip [[id number]]  . metoCLOPramide (REGLAN) 10 MG tablet    Sig: Take 1 tablet (10 mg total) by mouth 4 (four) times daily -  before meals and at bedtime.    Dispense:  120 tablet    Refill:  5    Order Specific Question:   Supervising Provider    Answer:   Raliegh Ip [[id number]]  . buPROPion (WELLBUTRIN XL) 300 MG 24 hr tablet    Sig: Take 1 tablet (300 mg total) by mouth daily.    Dispense:  90 tablet    Refill:  3    Please consider 90 day  supplies to promote better adherence    Order Specific Question:   Supervising Provider    Answer:   Raliegh Ip [[id number]]    Prudy Feeler PA-C [facility] [phone]

## 2019-09-24 ENCOUNTER — Encounter: Payer: Self-pay | Admitting: Physician Assistant

## 2019-10-09 ENCOUNTER — Encounter: Payer: Self-pay | Admitting: Physician Assistant

## 2019-10-29 ENCOUNTER — Encounter: Payer: Self-pay | Admitting: Physician Assistant

## 2019-10-30 ENCOUNTER — Other Ambulatory Visit: Payer: Self-pay | Admitting: Physician Assistant

## 2019-10-30 ENCOUNTER — Encounter: Payer: Self-pay | Admitting: Physician Assistant

## 2019-10-30 MED ORDER — AMOXICILLIN 500 MG PO CAPS: 500.0000 mg | ORAL_CAPSULE | Freq: Three times a day (TID) | ORAL | 0 refills | Status: DC

## 2019-11-01 ENCOUNTER — Encounter: Payer: Self-pay | Admitting: Physician Assistant

## 2019-12-05 ENCOUNTER — Ambulatory Visit (INDEPENDENT_AMBULATORY_CARE_PROVIDER_SITE_OTHER): Payer: BC Managed Care – PPO | Admitting: Physician Assistant

## 2019-12-05 ENCOUNTER — Encounter: Payer: Self-pay | Admitting: Physician Assistant

## 2019-12-05 DIAGNOSIS — F902 Attention-deficit hyperactivity disorder, combined type: Secondary | ICD-10-CM

## 2019-12-05 MED ORDER — METHYLPHENIDATE HCL ER (OSM) 54 MG PO TBCR: 54.0000 mg | EXTENDED_RELEASE_TABLET | ORAL | 0 refills | Status: DC

## 2019-12-05 NOTE — Progress Notes (Signed)
Telephone visit  Subjective: CC:ADD recheck PCP: Terald Sleeper, PA-C [id number] Erica Galloway is a 49 y.o. female calls for telephone consult today. Patient provides verbal consent for consult held via phone.  Patient is identified with 2 separate identifiers.  At this time the entire area is on COVID-19 social distancing and stay home orders are in place.  Patient is of higher risk and therefore we are performing this by a virtual method.  Location of patient: home Location of provider: HOME Others present for call: no  This patient returns for a 3 month recheck on ADD/ADHD and medication refills  Currently taking concerta 54 mg one daily. Behavior- normal Medication side effects- no Weight loss- no Sleeping habits- normal Any concerns- no   PDMP website reviewed: Yes Any suspicious activity; No UDS: 05/2019 Contract on file: Yes 06/2018    ROS: Per HPI  Allergies  Allergen Reactions  . Sulfur    Past Medical History:  Diagnosis Date  . Allergy   . Arthritis   . Carpal tunnel syndrome, bilateral     Current Outpatient Medications:  .  albuterol (VENTOLIN HFA) 108 (90 Base) MCG/ACT inhaler, Inhale 2 puffs into the lungs every 6 (six) hours as needed for wheezing or shortness of breath., Disp: 18 g, Rfl: 2 .  amoxicillin (AMOXIL) 500 MG capsule, Take 1 capsule (500 mg total) by mouth 3 (three) times daily., Disp: 30 capsule, Rfl: 0 .  Ascorbic Acid (VITAMIN C) 1000 MG tablet, Take 1,000 mg by mouth daily., Disp: , Rfl:  .  betamethasone valerate ointment (VALISONE) 0.1 %, Apply 1 application topically 2 (two) times daily., Disp: 30 g, Rfl: 0 .  buPROPion (WELLBUTRIN XL) 300 MG 24 hr tablet, Take 1 tablet (300 mg total) by mouth daily., Disp: 90 tablet, Rfl: 3 .  busPIRone (BUSPAR) 10 MG tablet, Take 1 tablet (10 mg total) by mouth 3 (three) times daily. Start with one daily for 3 days, then twice daily, then TID, Disp: 90 tablet, Rfl: 5 .  cetirizine (ZYRTEC) 10 MG  tablet, Take 10 mg by mouth daily., Disp: , Rfl:  .  clobetasol cream (TEMOVATE) 2.58 %, Apply 1 application topically 2 (two) times daily., Disp: 60 g, Rfl: 2 .  fluticasone (FLONASE) 50 MCG/ACT nasal spray, Place into both nostrils daily as needed for allergies or rhinitis., Disp: , Rfl:  .  meloxicam (MOBIC) 15 MG tablet, Take 0.5-1 tablets (7.5-15 mg total) by mouth daily as needed for pain., Disp: 30 tablet, Rfl: 0 .  methylphenidate 54 MG PO CR tablet, Take 1 tablet (54 mg total) by mouth every morning., Disp: 30 tablet, Rfl: 0 .  methylphenidate 54 MG PO CR tablet, Take 1 tablet (54 mg total) by mouth every morning., Disp: 30 tablet, Rfl: 0 .  methylphenidate 54 MG PO CR tablet, Take 1 tablet (54 mg total) by mouth every morning., Disp: 30 tablet, Rfl: 0 .  metoCLOPramide (REGLAN) 10 MG tablet, Take 1 tablet (10 mg total) by mouth 4 (four) times daily -  before meals and at bedtime., Disp: 120 tablet, Rfl: 5 .  SUMAtriptan (IMITREX) 50 MG tablet, Take 1 tablet (50 mg total) by mouth every 2 (two) hours as needed for migraine. May repeat in 2 hours if headache persists or recurs. (Patient not taking: Reported on 08/08/2019), Disp: 10 tablet, Rfl: 0 .  triamcinolone ointment (KENALOG) 0.5 %, Apply 1 application topically 2 (two) times daily., Disp: 30 g, Rfl: 0  Assessment/ Plan: 49 y.o. female   1. Attention deficit hyperactivity disorder (ADHD), combined type - methylphenidate 54 MG PO CR tablet; Take 1 tablet (54 mg total) by mouth every morning.  Dispense: 30 tablet; Refill: 0 - methylphenidate 54 MG PO CR tablet; Take 1 tablet (54 mg total) by mouth every morning.  Dispense: 30 tablet; Refill: 0 - methylphenidate 54 MG PO CR tablet; Take 1 tablet (54 mg total) by mouth every morning.  Dispense: 30 tablet; Refill: 0   Return in about 3 months (around 03/03/2020) for in office.  Continue all other maintenance medications as listed above.  Start time: 2:57 PM End time: 3:05 PM  Meds  ordered this encounter  Medications  . methylphenidate 54 MG PO CR tablet    Sig: Take 1 tablet (54 mg total) by mouth every morning.    Dispense:  30 tablet    Refill:  0    Fill 30 days from original script date    Order Specific Question:   Supervising Provider    Answer:   Raliegh Ip [[id number]]  . methylphenidate 54 MG PO CR tablet    Sig: Take 1 tablet (54 mg total) by mouth every morning.    Dispense:  30 tablet    Refill:  0    Fill 60 days from original script date    Order Specific Question:   Supervising Provider    Answer:   Raliegh Ip [[id number]]  . methylphenidate 54 MG PO CR tablet    Sig: Take 1 tablet (54 mg total) by mouth every morning.    Dispense:  30 tablet    Refill:  0    Order Specific Question:   Supervising Provider    Answer:   Raliegh Ip [[id number]]    Prudy Feeler PA-C [facility] [phone]

## 2019-12-16 ENCOUNTER — Encounter: Payer: Self-pay | Admitting: Physician Assistant

## 2020-01-02 ENCOUNTER — Ambulatory Visit: Payer: BC Managed Care – PPO | Admitting: Physician Assistant

## 2020-01-09 ENCOUNTER — Encounter: Payer: Self-pay | Admitting: Family Medicine

## 2020-03-03 ENCOUNTER — Ambulatory Visit: Payer: BC Managed Care – PPO | Admitting: Family

## 2020-03-03 ENCOUNTER — Encounter: Payer: Self-pay | Admitting: Family

## 2020-03-03 ENCOUNTER — Other Ambulatory Visit: Payer: Self-pay

## 2020-03-03 ENCOUNTER — Ambulatory Visit: Payer: BC Managed Care – PPO | Admitting: Physician Assistant

## 2020-03-03 VITALS — BP 128/74 | HR 85 | Temp 98.0°F | Ht 61.0 in | Wt 239.4 lb

## 2020-03-03 DIAGNOSIS — F902 Attention-deficit hyperactivity disorder, combined type: Secondary | ICD-10-CM

## 2020-03-03 DIAGNOSIS — F411 Generalized anxiety disorder: Secondary | ICD-10-CM

## 2020-03-03 DIAGNOSIS — Z79899 Other long term (current) drug therapy: Secondary | ICD-10-CM | POA: Insufficient documentation

## 2020-03-03 DIAGNOSIS — M1990 Unspecified osteoarthritis, unspecified site: Secondary | ICD-10-CM | POA: Insufficient documentation

## 2020-03-03 DIAGNOSIS — M8949 Other hypertrophic osteoarthropathy, multiple sites: Secondary | ICD-10-CM

## 2020-03-03 DIAGNOSIS — J4520 Mild intermittent asthma, uncomplicated: Secondary | ICD-10-CM | POA: Diagnosis not present

## 2020-03-03 DIAGNOSIS — M150 Primary generalized (osteo)arthritis: Secondary | ICD-10-CM

## 2020-03-03 DIAGNOSIS — M159 Polyosteoarthritis, unspecified: Secondary | ICD-10-CM

## 2020-03-03 MED ORDER — METHYLPHENIDATE HCL ER (OSM) 54 MG PO TBCR: 54.0000 mg | EXTENDED_RELEASE_TABLET | ORAL | 0 refills | Status: DC

## 2020-03-03 NOTE — Addendum Note (Signed)
Addended by: Jannifer Rodney A on: 03/03/2020 04:04 PM   Modules accepted: Orders

## 2020-03-03 NOTE — Patient Instructions (Signed)
Health Maintenance, Female Adopting a healthy lifestyle and getting preventive care are important in promoting health and wellness. Ask your health care provider about:  The right schedule for you to have regular tests and exams.  Things you can do on your own to prevent diseases and keep yourself healthy. What should I know about diet, weight, and exercise? Eat a healthy diet   Eat a diet that includes plenty of vegetables, fruits, low-fat dairy products, and lean protein.  Do not eat a lot of foods that are high in solid fats, added sugars, or sodium. Maintain a healthy weight Body mass index (BMI) is used to identify weight problems. It estimates body fat based on height and weight. Your health care provider can help determine your BMI and help you achieve or maintain a healthy weight. Get regular exercise Get regular exercise. This is one of the most important things you can do for your health. Most adults should:  Exercise for at least 150 minutes each week. The exercise should increase your heart rate and make you sweat (moderate-intensity exercise).  Do strengthening exercises at least twice a week. This is in addition to the moderate-intensity exercise.  Spend less time sitting. Even light physical activity can be beneficial. Watch cholesterol and blood lipids Have your blood tested for lipids and cholesterol at 51 years of age, then have this test every 5 years. Have your cholesterol levels checked more often if:  Your lipid or cholesterol levels are high.  You are older than 51 years of age.  You are at high risk for heart disease. What should I know about cancer screening? Depending on your health history and family history, you may need to have cancer screening at various ages. This may include screening for:  Breast cancer.  Cervical cancer.  Colorectal cancer.  Skin cancer.  Lung cancer. What should I know about heart disease, diabetes, and high blood  pressure? Blood pressure and heart disease  High blood pressure causes heart disease and increases the risk of stroke. This is more likely to develop in people who have high blood pressure readings, are of African descent, or are overweight.  Have your blood pressure checked: ? Every 3-5 years if you are 18-39 years of age. ? Every year if you are 40 years old or older. Diabetes Have regular diabetes screenings. This checks your fasting blood sugar level. Have the screening done:  Once every three years after age 40 if you are at a normal weight and have a low risk for diabetes.  More often and at a younger age if you are overweight or have a high risk for diabetes. What should I know about preventing infection? Hepatitis B If you have a higher risk for hepatitis B, you should be screened for this virus. Talk with your health care provider to find out if you are at risk for hepatitis B infection. Hepatitis C Testing is recommended for:  Everyone born from 1945 through 1965.  Anyone with known risk factors for hepatitis C. Sexually transmitted infections (STIs)  Get screened for STIs, including gonorrhea and chlamydia, if: ? You are sexually active and are younger than 51 years of age. ? You are older than 51 years of age and your health care provider tells you that you are at risk for this type of infection. ? Your sexual activity has changed since you were last screened, and you are at increased risk for chlamydia or gonorrhea. Ask your health care provider if   you are at risk.  Ask your health care provider about whether you are at high risk for HIV. Your health care provider may recommend a prescription medicine to help prevent HIV infection. If you choose to take medicine to prevent HIV, you should first get tested for HIV. You should then be tested every 3 months for as long as you are taking the medicine. Pregnancy  If you are about to stop having your period (premenopausal) and  you may become pregnant, seek counseling before you get pregnant.  Take 400 to 800 micrograms (mcg) of folic acid every day if you become pregnant.  Ask for birth control (contraception) if you want to prevent pregnancy. Osteoporosis and menopause Osteoporosis is a disease in which the bones lose minerals and strength with aging. This can result in bone fractures. If you are 65 years old or older, or if you are at risk for osteoporosis and fractures, ask your health care provider if you should:  Be screened for bone loss.  Take a calcium or vitamin D supplement to lower your risk of fractures.  Be given hormone replacement therapy (HRT) to treat symptoms of menopause. Follow these instructions at home: Lifestyle  Do not use any products that contain nicotine or tobacco, such as cigarettes, e-cigarettes, and chewing tobacco. If you need help quitting, ask your health care provider.  Do not use street drugs.  Do not share needles.  Ask your health care provider for help if you need support or information about quitting drugs. Alcohol use  Do not drink alcohol if: ? Your health care provider tells you not to drink. ? You are pregnant, may be pregnant, or are planning to become pregnant.  If you drink alcohol: ? Limit how much you use to 0-1 drink a day. ? Limit intake if you are breastfeeding.  Be aware of how much alcohol is in your drink. In the [place] one drink equals one 12 oz bottle of beer (355 mL), one 5 oz glass of wine (148 mL), or one 1 oz glass of hard liquor (44 mL). General instructions  Schedule regular health, dental, and eye exams.  Stay current with your vaccines.  Tell your health care provider if: ? You often feel depressed. ? You have ever been abused or do not feel safe at home. Summary  Adopting a healthy lifestyle and getting preventive care are important in promoting health and wellness.  Follow your health care provider's instructions about healthy  diet, exercising, and getting tested or screened for diseases.  Follow your health care provider's instructions on monitoring your cholesterol and blood pressure. This information is not intended to replace advice given to you by your health care provider. Make sure you discuss any questions you have with your health care provider. Document Revised: 09/20/2018 Document Reviewed: 09/20/2018 Elsevier Patient Education  2020 [employer]  

## 2020-03-03 NOTE — Progress Notes (Signed)
Subjective:    Patient ID: Erica Galloway, female    DOB: Jan 21, 1971, 49 y.o.   MRN: [id number]  Chief Complaint  Patient presents with  . Establish Care  . new contract for ADD refills   Pt presents to the office today to establish care.  Arthritis Presents for follow-up visit. She complains of pain and stiffness. The symptoms have been stable. Affected locations include the right MCP, left MCP, right knee, left knee and right hip. Her pain is at a severity of 5/10.  Anxiety Presents for follow-up visit. Symptoms include decreased concentration, excessive worry, irritability and nervous/anxious behavior. Symptoms occur most days. The severity of symptoms is moderate.   Her past medical history is significant for asthma.  Asthma She complains of cough and wheezing. This is a chronic problem. The current episode started more than 1 year ago. The problem occurs intermittently. Her symptoms are aggravated by exercise and pollen. Her past medical history is significant for asthma.  ADD Pt currently taking Concerta 54 mg daily. She reports this helps her stay focused at work and on task.     Review of Systems  Constitutional: Positive for irritability.  Respiratory: Positive for cough and wheezing.   Musculoskeletal: Positive for arthritis and stiffness.  Psychiatric/Behavioral: Positive for decreased concentration. The patient is nervous/anxious.   All other systems reviewed and are negative.      Objective:   Physical Exam Vitals reviewed.  Constitutional:      General: She is not in acute distress.    Appearance: She is well-developed. She is obese.  HENT:     Head: Normocephalic and atraumatic.     Right Ear: Tympanic membrane normal.     Left Ear: Tympanic membrane normal.  Eyes:     Pupils: Pupils are equal, round, and reactive to light.  Neck:     Thyroid: No thyromegaly.  Cardiovascular:     Rate and Rhythm: Normal rate and regular rhythm.     Heart sounds: Normal  heart sounds. No murmur.  Pulmonary:     Effort: Pulmonary effort is normal. No respiratory distress.     Breath sounds: Normal breath sounds. No wheezing.  Abdominal:     General: Bowel sounds are normal. There is no distension.     Palpations: Abdomen is soft.     Tenderness: There is no abdominal tenderness.  Musculoskeletal:        General: No tenderness. Normal range of motion.     Cervical back: Normal range of motion and neck supple.  Skin:    General: Skin is warm and dry.  Neurological:     Mental Status: She is alert and oriented to person, place, and time.     Cranial Nerves: No cranial nerve deficit.     Deep Tendon Reflexes: Reflexes are normal and symmetric.  Psychiatric:        Behavior: Behavior normal.        Thought Content: Thought content normal.        Judgment: Judgment normal.      BP 128/74   Pulse 85   Temp 98 F (36.7 C) (Temporal)   Ht '5\' 1"'  (1.549 m)   Wt 239 lb 6.4 oz (108.6 kg)   LMP  (LMP Unknown)   SpO2 98%   BMI 45.23 kg/m       Assessment & Plan:  Erica Galloway comes in today with chief complaint of Establish Care and new contract for ADD refills  Diagnosis and orders addressed:  1. Mild intermittent asthma, unspecified whether complicated - MPN36+RWER  2. Attention deficit hyperactivity disorder (ADHD), combined type Meds as prescribed Behavior modification as needed Follow-up for recheck in 3 months - Methylphenidate + Mtb, Urine - CMP14+EGFR - methylphenidate 54 MG PO CR tablet; Take 1 tablet (54 mg total) by mouth every morning.  Dispense: 30 tablet; Refill: 0 - methylphenidate 54 MG PO CR tablet; Take 1 tablet (54 mg total) by mouth every morning.  Dispense: 30 tablet; Refill: 0 - methylphenidate 54 MG PO CR tablet; Take 1 tablet (54 mg total) by mouth every morning.  Dispense: 30 tablet; Refill: 0  3. GAD (generalized anxiety disorder)  - CMP14+EGFR  4. Morbid obesity (HCC) - CMP14+EGFR  5. Primary  osteoarthritis involving multiple joints  - CMP14+EGFR  6. Controlled substance agreement signed  - Methylphenidate + Mtb, Urine - CMP14+EGFR - methylphenidate 54 MG PO CR tablet; Take 1 tablet (54 mg total) by mouth every morning.  Dispense: 30 tablet; Refill: 0 - methylphenidate 54 MG PO CR tablet; Take 1 tablet (54 mg total) by mouth every morning.  Dispense: 30 tablet; Refill: 0 - methylphenidate 54 MG PO CR tablet; Take 1 tablet (54 mg total) by mouth every morning.  Dispense: 30 tablet; Refill: 0   Labs pending Contract and drug screen up dated today, Pt reviewed in [place] controlled database, no red flags Health Maintenance reviewed Diet and exercise encouraged  Follow up plan: 3 months    Evelina Dun, FNP

## 2020-03-04 LAB — CMP14+EGFR
ALT: 12 IU/L (ref 0–32)
AST: 15 IU/L (ref 0–40)
Albumin/Globulin Ratio: 1.6 (ref 1.2–2.2)
Albumin: 4.4 g/dL (ref 3.8–4.8)
Alkaline Phosphatase: 127 IU/L — ABNORMAL HIGH (ref 48–121)
BUN/Creatinine Ratio: 20 (ref 9–23)
BUN: 17 mg/dL (ref 6–24)
Bilirubin Total: 0.4 mg/dL (ref 0.0–1.2)
CO2: 24 mmol/L (ref 20–29)
Calcium: 9.2 mg/dL (ref 8.7–10.2)
Chloride: 101 mmol/L (ref 96–106)
Creatinine, Ser: 0.86 mg/dL (ref 0.57–1.00)
GFR calc Af Amer: 91 mL/min/{1.73_m2} (ref >59)
GFR calc non Af Amer: 79 mL/min/{1.73_m2} (ref >59)
Globulin, Total: 2.7 g/dL (ref 1.5–4.5)
Glucose: 87 mg/dL (ref 65–99)
Potassium: 4.2 mmol/L (ref 3.5–5.2)
Sodium: 137 mmol/L (ref 134–144)
Total Protein: 7.1 g/dL (ref 6.0–8.5)

## 2020-03-06 LAB — METHYLPHENIDATE, SERUM: Methylphenidate: 12.1 ng/mL (ref 5.0–20.0)

## 2020-03-10 ENCOUNTER — Other Ambulatory Visit: Payer: Self-pay | Admitting: Family Medicine

## 2020-03-11 MED ORDER — TRIAMCINOLONE ACETONIDE 0.5 % EX OINT: 1.0000 "application " | TOPICAL_OINTMENT | Freq: Two times a day (BID) | CUTANEOUS | 0 refills | Status: DC

## 2020-03-21 ENCOUNTER — Other Ambulatory Visit: Payer: Self-pay | Admitting: Family

## 2020-03-21 MED ORDER — BUSPIRONE HCL 10 MG PO TABS: 10.0000 mg | ORAL_TABLET | Freq: Three times a day (TID) | ORAL | 5 refills | Status: DC

## 2020-06-03 ENCOUNTER — Ambulatory Visit (INDEPENDENT_AMBULATORY_CARE_PROVIDER_SITE_OTHER): Payer: BC Managed Care – PPO | Admitting: Family

## 2020-06-03 ENCOUNTER — Encounter: Payer: Self-pay | Admitting: Family

## 2020-06-03 DIAGNOSIS — J4520 Mild intermittent asthma, uncomplicated: Secondary | ICD-10-CM

## 2020-06-03 DIAGNOSIS — F902 Attention-deficit hyperactivity disorder, combined type: Secondary | ICD-10-CM

## 2020-06-03 DIAGNOSIS — F411 Generalized anxiety disorder: Secondary | ICD-10-CM

## 2020-06-03 DIAGNOSIS — M8949 Other hypertrophic osteoarthropathy, multiple sites: Secondary | ICD-10-CM

## 2020-06-03 DIAGNOSIS — Z79899 Other long term (current) drug therapy: Secondary | ICD-10-CM

## 2020-06-03 DIAGNOSIS — M159 Polyosteoarthritis, unspecified: Secondary | ICD-10-CM

## 2020-06-03 MED ORDER — BUSPIRONE HCL 10 MG PO TABS: 10.0000 mg | ORAL_TABLET | Freq: Three times a day (TID) | ORAL | 5 refills | Status: DC

## 2020-06-03 MED ORDER — BUPROPION HCL ER (XL) 300 MG PO TB24: 300.0000 mg | ORAL_TABLET | Freq: Every day | ORAL | 3 refills | Status: DC

## 2020-06-03 MED ORDER — METHYLPHENIDATE HCL ER (OSM) 54 MG PO TBCR: 54.0000 mg | EXTENDED_RELEASE_TABLET | ORAL | 0 refills | Status: DC

## 2020-06-03 NOTE — Progress Notes (Signed)
Virtual Visit via telephone Note Due to COVID-19 pandemic this visit was conducted virtually. This visit type was conducted due to national recommendations for restrictions regarding the COVID-19 Pandemic (e.g. social distancing, sheltering in place) in an effort to limit this patient's exposure and mitigate transmission in our community. All issues noted in this document were discussed and addressed.  A physical exam was not performed with this format.  I connected with Erica Galloway on 06/03/20 at 2:39 pm  by telephone and verified that I am speaking with the correct person using two identifiers. Erica Galloway is currently located at car and no one is currently with her during visit. The provider, Jannifer Rodney, FNP is located in their office at time of visit.  I discussed the limitations, risks, security and privacy concerns of performing an evaluation and management service by telephone and the availability of in person appointments. I also discussed with the patient that there may be a patient responsible charge related to this service. The patient expressed understanding and agreed to proceed.   History and Present Illness:  Pt calls the office today for chronic follow up.  Asthma There is no cough, shortness of breath or sputum production. The current episode started more than 1 year ago. The problem has been waxing and waning. Pertinent negatives include no heartburn or sore throat. Her past medical history is significant for asthma.  Arthritis Presents for follow-up visit. She complains of pain and stiffness. The symptoms have been resolved. Affected locations include the right ankle, left knee, right knee, left hip and right hip. Her pain is at a severity of 1/10.  Depression        This is a chronic problem.  The current episode started more than 1 year ago.   The onset quality is gradual.   The problem occurs intermittently.  Associated symptoms include irritable and restlessness.   Associated symptoms include no helplessness and no hopelessness.  Past treatments include SSRIs - Selective serotonin reuptake inhibitors.  Compliance with treatment is good.  Past medical history includes anxiety.   Anxiety Presents for follow-up visit. Symptoms include excessive worry, irritability, nervous/anxious behavior and restlessness. Patient reports no shortness of breath.   Her past medical history is significant for asthma.   ADHD Pt taking methylphenidate 54 mg daily. Doing well. Helps her stay on task and focused. No complaints at this time.   Review of Systems  Constitutional: Positive for irritability.  HENT: Negative for sore throat.   Respiratory: Negative for cough, sputum production and shortness of breath.   Gastrointestinal: Negative for heartburn.  Musculoskeletal: Positive for arthritis and stiffness.  Psychiatric/Behavioral: Positive for depression. The patient is nervous/anxious.   All other systems reviewed and are negative.    Observations/Objective: No SOB or distress noted  Assessment and Plan: Erica Galloway comes in today with chief complaint of No chief complaint on file.   Diagnosis and orders addressed:  1. Mild intermittent asthma, unspecified whether complicated  2. Primary osteoarthritis involving multiple joints  3. Attention deficit hyperactivity disorder (ADHD), combined type Meds as prescribed Behavior modification as needed Follow-up for recheck in 3 months Patient reviewed in [place] controlled database, no flags noted. Contract and drug screen are up to date.  - methylphenidate 54 MG PO CR tablet; Take 1 tablet (54 mg total) by mouth every morning.  Dispense: 30 tablet; Refill: 0 - methylphenidate 54 MG PO CR tablet; Take 1 tablet (54 mg total) by mouth every morning.  Dispense: 30 tablet; Refill: 0 - methylphenidate 54 MG PO CR tablet; Take 1 tablet (54 mg total) by mouth every morning.  Dispense: 30 tablet; Refill: 0 - buPROPion  (WELLBUTRIN XL) 300 MG 24 hr tablet; Take 1 tablet (300 mg total) by mouth daily.  Dispense: 90 tablet; Refill: 3  4. Morbid obesity (HCC)  5. GAD (generalized anxiety disorder)  6. Controlled substance agreement signed - methylphenidate 54 MG PO CR tablet; Take 1 tablet (54 mg total) by mouth every morning.  Dispense: 30 tablet; Refill: 0 - methylphenidate 54 MG PO CR tablet; Take 1 tablet (54 mg total) by mouth every morning.  Dispense: 30 tablet; Refill: 0 - methylphenidate 54 MG PO CR tablet; Take 1 tablet (54 mg total) by mouth every morning.  Dispense: 30 tablet; Refill: 0   Health Maintenance reviewed Diet and exercise encouraged  Follow up plan: 3 months    I discussed the assessment and treatment plan with the patient. The patient was provided an opportunity to ask questions and all were answered. The patient agreed with the plan and demonstrated an understanding of the instructions.   The patient was advised to call back or seek an in-person evaluation if the symptoms worsen or if the condition fails to improve as anticipated.  The above assessment and management plan was discussed with the patient. The patient verbalized understanding of and has agreed to the management plan. Patient is aware to call the clinic if symptoms persist or worsen. Patient is aware when to return to the clinic for a follow-up visit. Patient educated on when it is appropriate to go to the emergency department.   Time call ended:  2:48 pm   I provided 9 minutes of non-face-to-face time during this encounter.    Jannifer Rodney, FNP

## 2020-06-20 ENCOUNTER — Encounter: Payer: Self-pay | Admitting: Nurse Practitioner

## 2020-06-20 ENCOUNTER — Ambulatory Visit (INDEPENDENT_AMBULATORY_CARE_PROVIDER_SITE_OTHER): Payer: BC Managed Care – PPO | Admitting: Nurse Practitioner

## 2020-06-20 DIAGNOSIS — J0101 Acute recurrent maxillary sinusitis: Secondary | ICD-10-CM | POA: Diagnosis not present

## 2020-06-20 MED ORDER — AMOXICILLIN-POT CLAVULANATE 875-125 MG PO TABS
1.0000 | ORAL_TABLET | Freq: Two times a day (BID) | ORAL | 0 refills | Status: DC
Start: 2020-06-20 — End: 2020-09-02

## 2020-06-20 NOTE — Progress Notes (Signed)
Virtual Visit via telephone Note Due to COVID-19 pandemic this visit was conducted virtually. This visit type was conducted due to national recommendations for restrictions regarding the COVID-19 Pandemic (e.g. social distancing, sheltering in place) in an effort to limit this patient's exposure and mitigate transmission in our community. All issues noted in this document were discussed and addressed.  A physical exam was not performed with this format.  I connected with Erica Galloway on 06/20/20 at 3:00 by telephone and verified that I am speaking with the correct person using two identifiers. Erica Galloway is currently located at home and no one is currently with her during visit. The provider, Mary-Margaret Daphine Deutscher, FNP is located in their office at time of visit.  I discussed the limitations, risks, security and privacy concerns of performing an evaluation and management service by telephone and the availability of in person appointments. I also discussed with the patient that there may be a patient responsible charge related to this service. The patient expressed understanding and agreed to proceed.   History and Present Illness:   Chief Complaint: Cough   HPI Patient calls in c/o sinus driange that causes her to cough. Pressure behind eyes. Started 5 days ago. Denies covid exposure. Has not had vaccines   Review of Systems  Constitutional: Negative for chills and fever.  HENT: Positive for congestion and sinus pain.   Respiratory: Positive for cough (slight).   Cardiovascular: Negative.   Neurological: Positive for headaches.  All other systems reviewed and are negative.    Observations/Objective: Alert and oriented- answers all questions appropriately No distress Voice hoarse No cough during visit  Assessment and Plan: Erica Galloway in today with chief complaint of Cough   1. Acute recurrent maxillary sinusitis 1. Take meds as prescribed 2. Use a cool mist  humidifier especially during the winter months and when heat has been humid. 3. Use saline nose sprays frequently 4. Saline irrigations of the nose can be very helpful if done frequently.  * 4X daily for 1 week*  * Use of a nettie pot can be helpful with this. Follow directions with this* 5. Drink plenty of fluids 6. Keep thermostat turn down low 7.For any cough or congestion  Use plain Mucinex- regular strength or max strength is fine   * Children- consult with Pharmacist for dosing 8. For fever or aces or pains- take tylenol or ibuprofen appropriate for age and weight.  * for fevers greater than 101 orally you may alternate ibuprofen and tylenol every  3 hours.   Meds ordered this encounter  Medications  . amoxicillin-clavulanate (AUGMENTIN) 875-125 MG tablet    Sig: Take 1 tablet by mouth 2 (two) times daily.    Dispense:  14 tablet    Refill:  0    Order Specific Question:   Supervising Provider    Answer:   Arville Care A [[id number]]         Follow Up Instructions: prn    I discussed the assessment and treatment plan with the patient. The patient was provided an opportunity to ask questions and all were answered. The patient agreed with the plan and demonstrated an understanding of the instructions.   The patient was advised to call back or seek an in-person evaluation if the symptoms worsen or if the condition fails to improve as anticipated.  The above assessment and management plan was discussed with the patient. The patient verbalized understanding of and has agreed to the management  plan. Patient is aware to call the clinic if symptoms persist or worsen. Patient is aware when to return to the clinic for a follow-up visit. Patient educated on when it is appropriate to go to the emergency department.   Time call ended:  3:15  I provided 15 minutes of non-face-to-face time during this encounter.    Mary-Margaret Daphine Deutscher, FNP

## 2020-06-23 ENCOUNTER — Encounter: Payer: Self-pay | Admitting: Nurse Practitioner

## 2020-06-24 ENCOUNTER — Ambulatory Visit (INDEPENDENT_AMBULATORY_CARE_PROVIDER_SITE_OTHER): Payer: BC Managed Care – PPO | Admitting: Nurse Practitioner

## 2020-06-24 ENCOUNTER — Encounter: Payer: Self-pay | Admitting: Nurse Practitioner

## 2020-06-24 DIAGNOSIS — R059 Cough, unspecified: Secondary | ICD-10-CM

## 2020-06-24 DIAGNOSIS — R05 Cough: Secondary | ICD-10-CM | POA: Diagnosis not present

## 2020-06-24 DIAGNOSIS — R52 Pain, unspecified: Secondary | ICD-10-CM

## 2020-06-24 DIAGNOSIS — Z20822 Contact with and (suspected) exposure to covid-19: Secondary | ICD-10-CM

## 2020-06-24 DIAGNOSIS — R5081 Fever presenting with conditions classified elsewhere: Secondary | ICD-10-CM | POA: Diagnosis not present

## 2020-06-24 NOTE — Progress Notes (Signed)
Virtual Visit via telephone Note Due to COVID-19 pandemic this visit was conducted virtually. This visit type was conducted due to national recommendations for restrictions regarding the COVID-19 Pandemic (e.g. social distancing, sheltering in place) in an effort to limit this patient's exposure and mitigate transmission in our community. All issues noted in this document were discussed and addressed.  A physical exam was not performed with this format.  I connected with Erica Galloway on 06/24/20 at 9:50 by telephone and verified that I am speaking with the correct person using two identifiers. Erica Galloway is currently located at home and no one is currently with her during visit. The provider, Mary-Margaret Daphine Deutscher, FNP is located in their office at time of visit.  I discussed the limitations, risks, security and privacy concerns of performing an evaluation and management service by telephone and the availability of in person appointments. I also discussed with the patient that there may be a patient responsible charge related to this service. The patient expressed understanding and agreed to proceed.   History and Present Illness:   Chief Complaint: URI   HPI Patient calls in c/o fatigue, body aches and cough. She has been on antibiotic for 3 days and no change. She just found out yesterday that her parents and her sister have covid and she was around them last week.   Review of Systems  Constitutional: Positive for chills and fever.  HENT: Positive for congestion.   Respiratory: Positive for cough.   Musculoskeletal: Positive for myalgias.  Neurological: Positive for dizziness and headaches.  All other systems reviewed and are negative.    Observations/Objective: Alert and oriented- answers all questions appropriately Mild  distress Deep cough Sob when talking   Assessment and Plan: Erica Galloway in today with chief complaint of URI   1. Cough 2. Fever in other  diseases 3. Body aches 4. Close exposure to COVID-19 virus 1. Take meds as prescribed 2. Use a cool mist humidifier especially during the winter months and when heat has been humid. 3. Use saline nose sprays frequently 4. Saline irrigations of the nose can be very helpful if done frequently.  * 4X daily for 1 week*  * Use of a nettie pot can be helpful with this. Follow directions with this* 5. Drink plenty of fluids 6. Keep thermostat turn down low 7.For any cough or congestion  Use plain Mucinex- regular strength or max strength is fine   * Children- consult with Pharmacist for dosing 8. For fever or aces or pains- take tylenol or ibuprofen appropriate for age and weight.  * for fevers greater than 101 orally you may alternate ibuprofen and tylenol every  3 hours.    - Novel Coronavirus, NAA ([facility]); Future  Quarantine for 10 days Call if worsening- will send to infusion clinic   Follow Up Instructions: prn    I discussed the assessment and treatment plan with the patient. The patient was provided an opportunity to ask questions and all were answered. The patient agreed with the plan and demonstrated an understanding of the instructions.   The patient was advised to call back or seek an in-person evaluation if the symptoms worsen or if the condition fails to improve as anticipated.  The above assessment and management plan was discussed with the patient. The patient verbalized understanding of and has agreed to the management plan. Patient is aware to call the clinic if symptoms persist or worsen. Patient is aware when to return  to the clinic for a follow-up visit. Patient educated on when it is appropriate to go to the emergency department.   Time call ended:  10:10  I provided 20 minutes of non-face-to-face time during this encounter.    Mary-Margaret Daphine Deutscher, FNP

## 2020-06-24 NOTE — Addendum Note (Signed)
Addended by: Prescott Gum on: 06/24/2020 11:00 AM   Modules accepted: Orders

## 2020-06-26 ENCOUNTER — Other Ambulatory Visit (HOSPITAL_COMMUNITY): Payer: Self-pay | Admitting: Nurse Practitioner

## 2020-06-26 DIAGNOSIS — U071 COVID-19: Secondary | ICD-10-CM

## 2020-06-26 DIAGNOSIS — J4520 Mild intermittent asthma, uncomplicated: Secondary | ICD-10-CM

## 2020-06-26 LAB — NOVEL CORONAVIRUS, NAA: SARS-CoV-2, NAA: DETECTED — AB

## 2020-06-26 LAB — SARS-COV-2, NAA 2 DAY TAT

## 2020-06-26 NOTE — Progress Notes (Signed)
I connected by phone with Erica Galloway on 06/26/2020 at 12:54 PM to discuss the potential use of a new treatment for mild to moderate COVID-19 viral infection in non-hospitalized patients.  This patient is a 49 y.o. female that meets the FDA criteria for Emergency Use Authorization of COVID monoclonal antibody casirivimab/imdevimab.  Has a (+) direct SARS-CoV-2 viral test result  Has mild or moderate COVID-19   Is NOT hospitalized due to COVID-19  Is within 10 days of symptom onset  Has at least one of the high risk factor(s) for progression to severe COVID-19 and/or hospitalization as defined in EUA.  Specific high risk criteria : BMI > 25 and Chronic Lung Disease   I have spoken and communicated the following to the patient or parent/caregiver regarding COVID monoclonal antibody treatment:  1. FDA has authorized the emergency use for the treatment of mild to moderate COVID-19 in adults and pediatric patients with positive results of direct SARS-CoV-2 viral testing who are 48 years of age and older weighing at least 40 kg, and who are at high risk for progressing to severe COVID-19 and/or hospitalization.  2. The significant known and potential risks and benefits of COVID monoclonal antibody, and the extent to which such potential risks and benefits are unknown.  3. Information on available alternative treatments and the risks and benefits of those alternatives, including clinical trials.  4. Patients treated with COVID monoclonal antibody should continue to self-isolate and use infection control measures (e.g., wear mask, isolate, social distance, avoid sharing personal items, clean and disinfect "high touch" surfaces, and frequent handwashing) according to CDC guidelines.   5. The patient or parent/caregiver has the option to accept or refuse COVID monoclonal antibody treatment.  After reviewing this information with the patient, The patient agreed to proceed with receiving  casirivimab\imdevimab infusion and will be provided a copy of the Fact sheet prior to receiving the infusion. [name] 06/26/2020 12:54 PM

## 2020-06-27 ENCOUNTER — Ambulatory Visit (HOSPITAL_COMMUNITY)
Admission: RE | Admit: 2020-06-27 | Discharge: 2020-06-27 | Disposition: A | Discharge status: Home / Self Care | Payer: BC Managed Care – PPO | Source: Ambulatory Visit | Attending: Pulmonary Disease | Admitting: Pulmonary Disease

## 2020-06-27 DIAGNOSIS — U071 COVID-19: Secondary | ICD-10-CM

## 2020-06-27 DIAGNOSIS — J4520 Mild intermittent asthma, uncomplicated: Secondary | ICD-10-CM | POA: Diagnosis not present

## 2020-06-27 MED ORDER — SODIUM CHLORIDE 0.9 % IV SOLN
1200.0000 mg | Freq: Once | INTRAVENOUS | Status: AC
  Administered 2020-06-27: 1200 mg via INTRAVENOUS

## 2020-06-27 MED ORDER — DIPHENHYDRAMINE HCL 50 MG/ML IJ SOLN: 50.0000 mg | Freq: Once | INTRAMUSCULAR | Status: DC | PRN

## 2020-06-27 MED ORDER — EPINEPHRINE 0.3 MG/0.3ML IJ SOAJ: 0.3000 mg | Freq: Once | INTRAMUSCULAR | Status: DC | PRN

## 2020-06-27 MED ORDER — FAMOTIDINE IN NACL 20-0.9 MG/50ML-% IV SOLN: 20.0000 mg | Freq: Once | INTRAVENOUS | Status: DC | PRN

## 2020-06-27 MED ORDER — ALBUTEROL SULFATE HFA 108 (90 BASE) MCG/ACT IN AERS: 2.0000 | INHALATION_SPRAY | Freq: Once | RESPIRATORY_TRACT | Status: DC | PRN

## 2020-06-27 MED ORDER — METHYLPREDNISOLONE SODIUM SUCC 125 MG IJ SOLR: 125.0000 mg | Freq: Once | INTRAMUSCULAR | Status: DC | PRN

## 2020-06-27 MED ORDER — SODIUM CHLORIDE 0.9 % IV SOLN: INTRAVENOUS | Status: DC | PRN

## 2020-06-27 NOTE — Progress Notes (Signed)
  Diagnosis: COVID-19  Physician: Patrick Wright, MD  Procedure: Covid Infusion Clinic Med: casirivimab\imdevimab infusion - Provided patient with casirivimab\imdevimab fact sheet for patients, parents and caregivers prior to infusion.  Complications: No immediate complications noted.  Discharge: Discharged home   Erica Galloway [name] 06/27/2020  

## 2020-06-27 NOTE — Discharge Instructions (Signed)

## 2020-07-04 ENCOUNTER — Encounter: Payer: Self-pay | Admitting: Nurse Practitioner

## 2020-07-20 IMAGING — DX DG KNEE 1-2V*R*
2 series · 2 of 2 positions shown · non-contrast
Comparison: AP view of the left knee

CLINICAL DATA: Fall, right knee pain.

EXAM:
RIGHT KNEE - 1-2 VIEW

[knee ap]
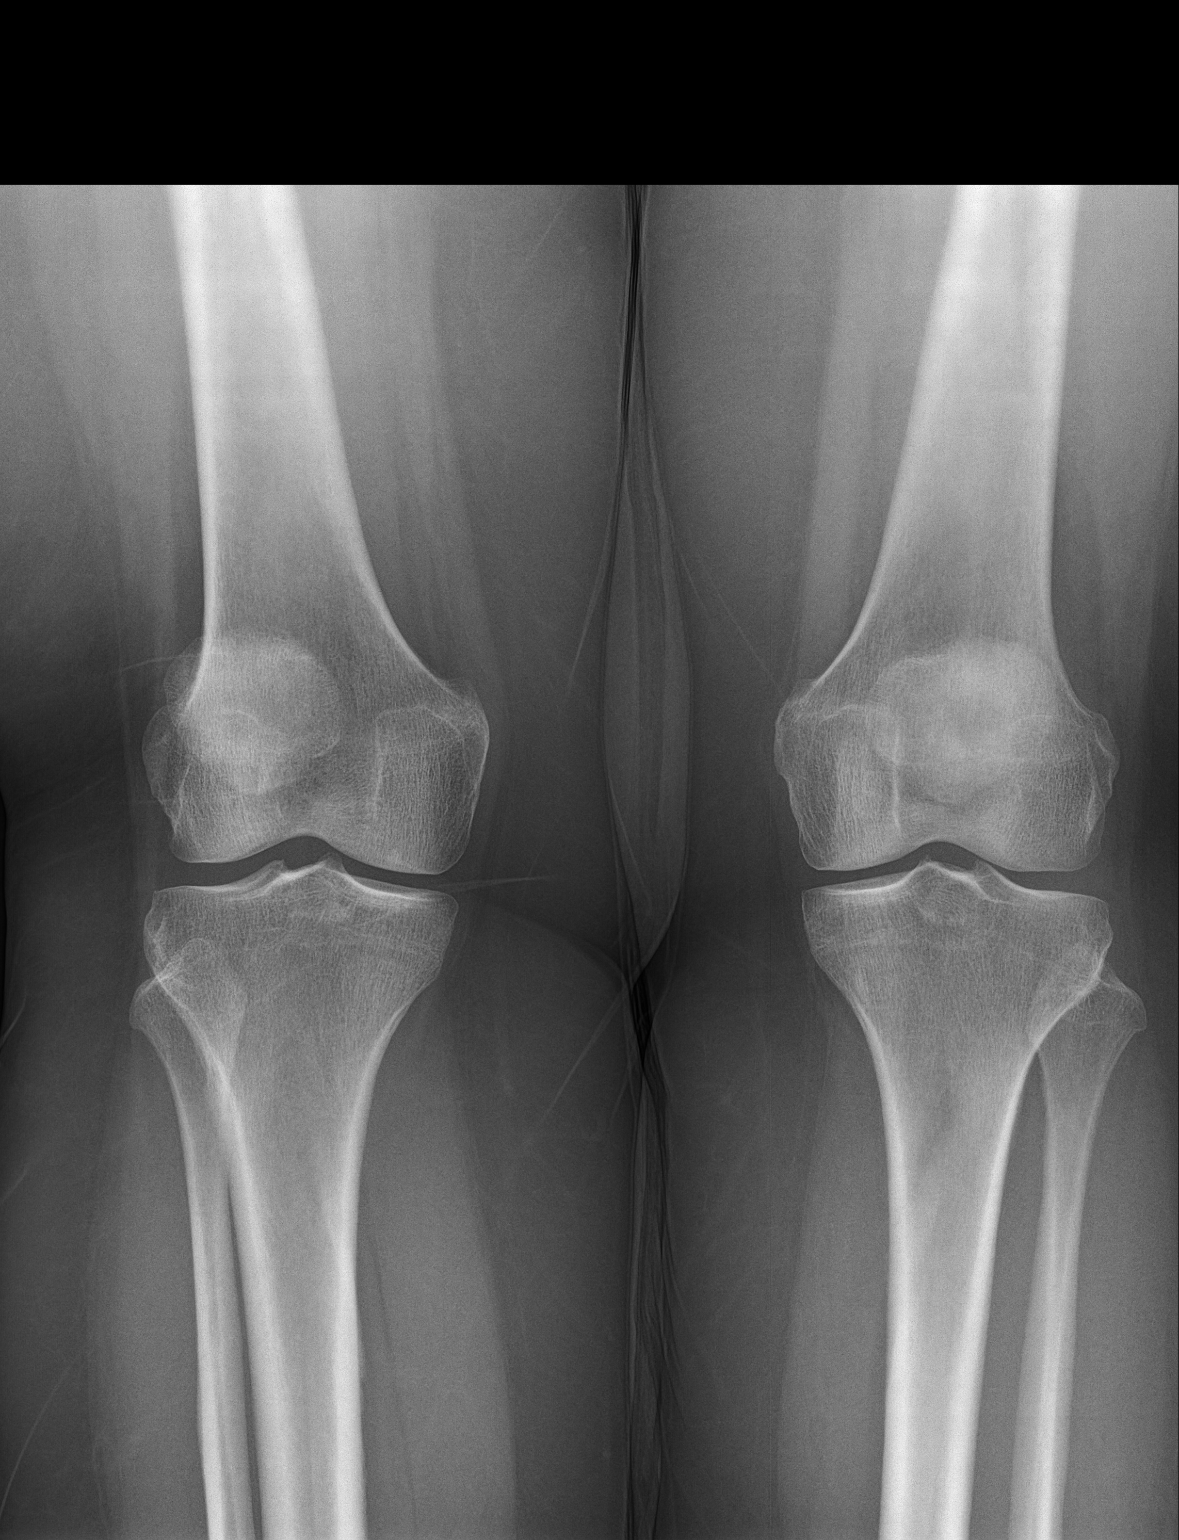

[knee lat]
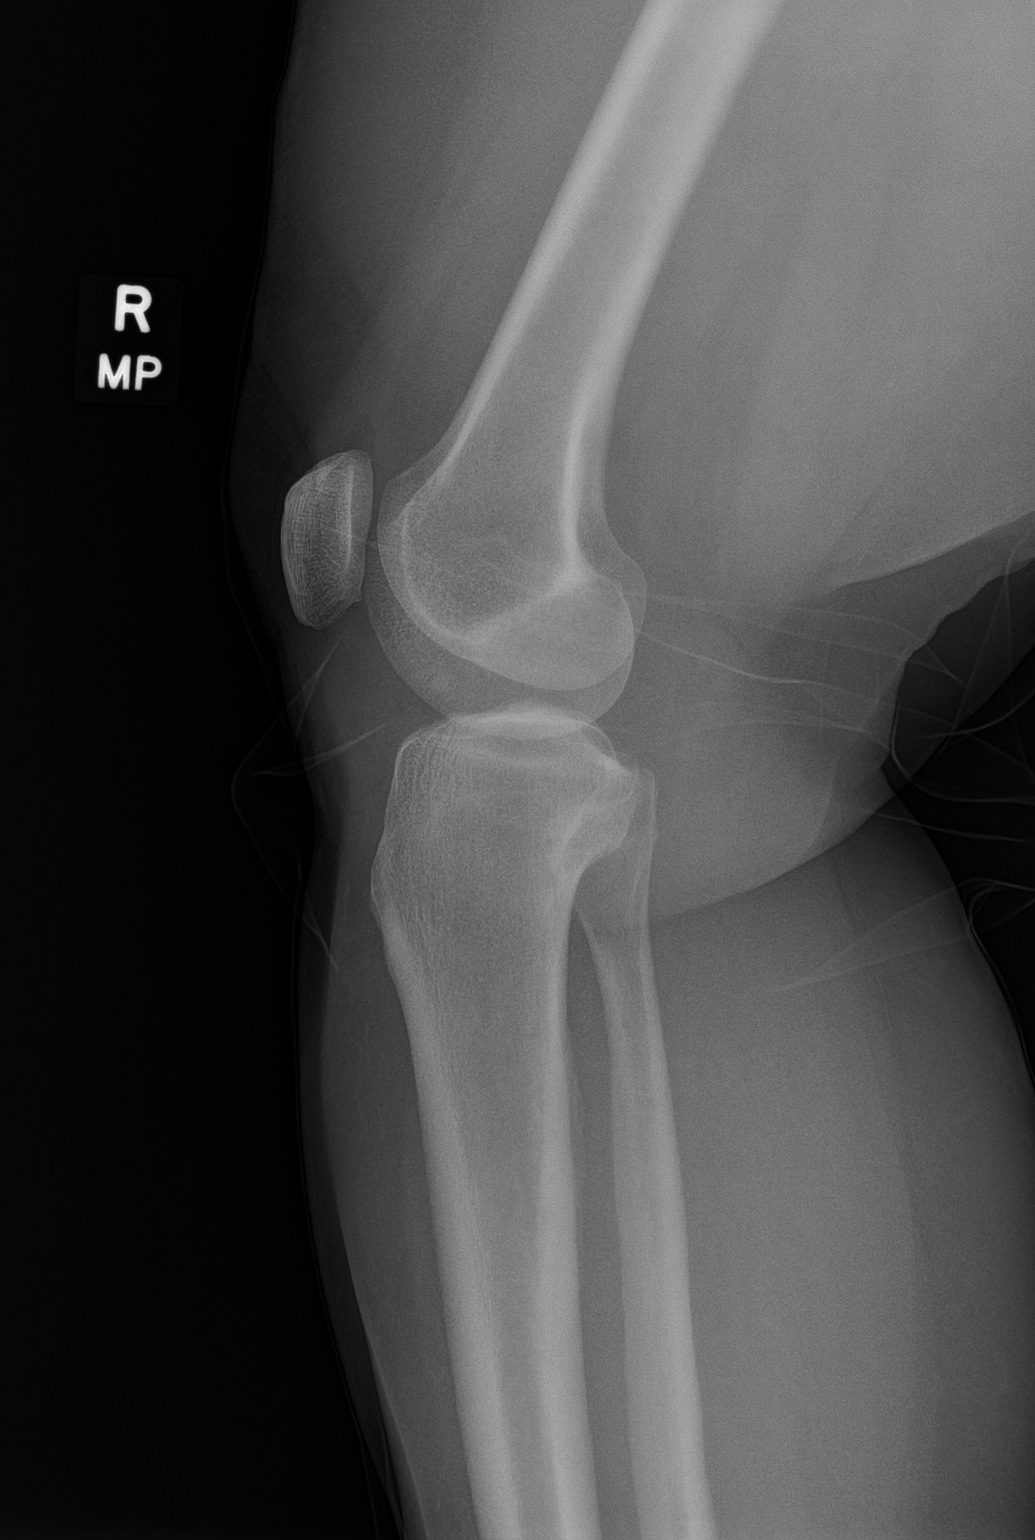

[2 of 2 positions shown; findings below may reference images not displayed]

FINDINGS: No evidence of fracture, dislocation, or joint effusion. No evidence
of arthropathy or other focal bone abnormality. Soft tissues are
unremarkable. AP view of the left knee was included for comparison
and unremarkable.
IMPRESSION: Negative.

## 2020-09-02 ENCOUNTER — Encounter: Payer: Self-pay | Admitting: Family

## 2020-09-02 ENCOUNTER — Ambulatory Visit (INDEPENDENT_AMBULATORY_CARE_PROVIDER_SITE_OTHER): Payer: BC Managed Care – PPO | Admitting: Family

## 2020-09-02 DIAGNOSIS — F902 Attention-deficit hyperactivity disorder, combined type: Secondary | ICD-10-CM | POA: Diagnosis not present

## 2020-09-02 DIAGNOSIS — M8949 Other hypertrophic osteoarthropathy, multiple sites: Secondary | ICD-10-CM

## 2020-09-02 DIAGNOSIS — Z1211 Encounter for screening for malignant neoplasm of colon: Secondary | ICD-10-CM

## 2020-09-02 DIAGNOSIS — J4520 Mild intermittent asthma, uncomplicated: Secondary | ICD-10-CM | POA: Diagnosis not present

## 2020-09-02 DIAGNOSIS — Z79899 Other long term (current) drug therapy: Secondary | ICD-10-CM

## 2020-09-02 DIAGNOSIS — F411 Generalized anxiety disorder: Secondary | ICD-10-CM

## 2020-09-02 DIAGNOSIS — M159 Polyosteoarthritis, unspecified: Secondary | ICD-10-CM

## 2020-09-02 MED ORDER — METHYLPHENIDATE HCL ER (OSM) 54 MG PO TBCR: 54.0000 mg | EXTENDED_RELEASE_TABLET | ORAL | 0 refills | Status: DC

## 2020-09-02 MED ORDER — BUPROPION HCL ER (XL) 300 MG PO TB24: 300.0000 mg | ORAL_TABLET | Freq: Every day | ORAL | 3 refills | Status: DC

## 2020-09-02 MED ORDER — ALBUTEROL SULFATE HFA 108 (90 BASE) MCG/ACT IN AERS: 2.0000 | INHALATION_SPRAY | Freq: Four times a day (QID) | RESPIRATORY_TRACT | 2 refills | Status: AC | PRN

## 2020-09-02 MED ORDER — BUSPIRONE HCL 10 MG PO TABS: 10.0000 mg | ORAL_TABLET | Freq: Three times a day (TID) | ORAL | 5 refills | Status: DC

## 2020-09-02 MED ORDER — TRIAMCINOLONE ACETONIDE 0.5 % EX OINT: 1.0000 "application " | TOPICAL_OINTMENT | Freq: Two times a day (BID) | CUTANEOUS | 0 refills | Status: DC

## 2020-09-02 NOTE — Progress Notes (Signed)
Virtual Visit via telephone Note Due to COVID-19 pandemic this visit was conducted virtually. This visit type was conducted due to national recommendations for restrictions regarding the COVID-19 Pandemic (e.g. social distancing, sheltering in place) in an effort to limit this patient's exposure and mitigate transmission in our community. All issues noted in this document were discussed and addressed.  A physical exam was not performed with this format.  I connected with Erica Galloway on 09/02/20 at 2:11 pm by telephone and verified that I am speaking with the correct person using two identifiers. Erica Galloway is currently located at work and no one is currently with her during visit. The provider, Jannifer Rodney, FNP is located in their office at time of visit.  I discussed the limitations, risks, security and privacy concerns of performing an evaluation and management service by telephone and the availability of in person appointments. I also discussed with the patient that there may be a patient responsible charge related to this service. The patient expressed understanding and agreed to proceed.   History and Present Illness:  Pt calls the office today for chronic follow up.  Asthma She complains of cough. There is no hoarse voice, shortness of breath or wheezing. This is a chronic problem. The current episode started more than 1 year ago. The problem occurs intermittently. The cough is non-productive. She reports moderate improvement on treatment. Her past medical history is significant for asthma.  Arthritis Presents for follow-up visit. She complains of stiffness and joint warmth. The symptoms have been stable. Affected locations include the left shoulder, right shoulder, left ankle, right ankle, left hip and right hip. Her pain is at a severity of 9/10.  Anxiety Presents for follow-up visit. Symptoms include excessive worry, irritability, nervous/anxious behavior and restlessness.  Patient reports no shortness of breath.   Her past medical history is significant for asthma.  ADHD PT currently taking Concerta 54 mg daily. This helps her stay focused and on task.     Review of Systems  Constitutional: Positive for irritability.  HENT: Negative for hoarse voice.   Respiratory: Positive for cough. Negative for shortness of breath and wheezing.   Musculoskeletal: Positive for arthritis and stiffness.  Psychiatric/Behavioral: The patient is nervous/anxious.   All other systems reviewed and are negative.    Observations/Objective: NO SOB or distress noted   Assessment and Plan: 1. Mild intermittent asthma, unspecified whether complicated - albuterol (VENTOLIN HFA) 108 (90 Base) MCG/ACT inhaler; Inhale 2 puffs into the lungs every 6 (six) hours as needed for wheezing or shortness of breath.  Dispense: 18 g; Refill: 2  2. Primary osteoarthritis involving multiple joints  3. Attention deficit hyperactivity disorder (ADHD), combined type Meds as prescribed Behavior modification as needed Follow-up for recheck in 3 months - methylphenidate 54 MG PO CR tablet; Take 1 tablet (54 mg total) by mouth every morning.  Dispense: 30 tablet; Refill: 0 - methylphenidate 54 MG PO CR tablet; Take 1 tablet (54 mg total) by mouth every morning.  Dispense: 30 tablet; Refill: 0 - methylphenidate 54 MG PO CR tablet; Take 1 tablet (54 mg total) by mouth every morning.  Dispense: 30 tablet; Refill: 0 - buPROPion (WELLBUTRIN XL) 300 MG 24 hr tablet; Take 1 tablet (300 mg total) by mouth daily.  Dispense: 90 tablet; Refill: 3  4. Morbid obesity (HCC)  5. GAD (generalized anxiety disorder) - busPIRone (BUSPAR) 10 MG tablet; Take 1 tablet (10 mg total) by mouth 3 (three) times daily. Start  with one daily for 3 days, then twice daily, then TID  Dispense: 90 tablet; Refill: 5  6. Controlled substance agreement signed - methylphenidate 54 MG PO CR tablet; Take 1 tablet (54 mg total) by  mouth every morning.  Dispense: 30 tablet; Refill: 0 - methylphenidate 54 MG PO CR tablet; Take 1 tablet (54 mg total) by mouth every morning.  Dispense: 30 tablet; Refill: 0 - methylphenidate 54 MG PO CR tablet; Take 1 tablet (54 mg total) by mouth every morning.  Dispense: 30 tablet; Refill: 0  7. Colon cancer screening - Ambulatory referral to Gastroenterology     I discussed the assessment and treatment plan with the patient. The patient was provided an opportunity to ask questions and all were answered. The patient agreed with the plan and demonstrated an understanding of the instructions.   The patient was advised to call back or seek an in-person evaluation if the symptoms worsen or if the condition fails to improve as anticipated.  The above assessment and management plan was discussed with the patient. The patient verbalized understanding of and has agreed to the management plan. Patient is aware to call the clinic if symptoms persist or worsen. Patient is aware when to return to the clinic for a follow-up visit. Patient educated on when it is appropriate to go to the emergency department.   Time call ended:  2:32 pm  I provided 21 minutes of non-face-to-face time during this encounter.    Jannifer Rodney, FNP

## 2020-09-12 ENCOUNTER — Encounter: Payer: Self-pay | Admitting: Internal Medicine

## 2020-10-27 ENCOUNTER — Ambulatory Visit: Payer: BC Managed Care – PPO | Admitting: Gastroenterology

## 2020-11-11 ENCOUNTER — Ambulatory Visit: Payer: BC Managed Care – PPO | Admitting: Family

## 2020-11-14 ENCOUNTER — Other Ambulatory Visit: Payer: Self-pay

## 2020-11-14 ENCOUNTER — Encounter: Payer: Self-pay | Admitting: Family Medicine

## 2020-11-14 ENCOUNTER — Ambulatory Visit: Payer: BC Managed Care – PPO | Admitting: Family Medicine

## 2020-11-14 VITALS — BP 111/61 | HR 81 | Temp 97.8°F | Ht 61.0 in | Wt 247.1 lb

## 2020-11-14 DIAGNOSIS — H579 Unspecified disorder of eye and adnexa: Secondary | ICD-10-CM

## 2020-11-14 DIAGNOSIS — S00201A Unspecified superficial injury of right eyelid and periocular area, initial encounter: Secondary | ICD-10-CM | POA: Diagnosis not present

## 2020-11-14 DIAGNOSIS — T148XXA Other injury of unspecified body region, initial encounter: Secondary | ICD-10-CM

## 2020-11-14 MED ORDER — AMOXICILLIN-POT CLAVULANATE 875-125 MG PO TABS: 1.0000 | ORAL_TABLET | Freq: Two times a day (BID) | ORAL | 0 refills | Status: DC

## 2020-11-14 NOTE — Progress Notes (Signed)
Established Patient Office Visit  Subjective:  Patient ID: Erica Galloway, female    DOB: 10-06-1971  Age: 50 y.o. MRN: [id number]  CC:  Chief Complaint  Patient presents with  . Eye Problem    HPI Erica Galloway presents for a an right eye problem. Her dog scratched her eye 6 weeks ago along her right lower eye lid. She tried to schedule an appointment with an eye doctor but it is 2 months from now before she can be seen. She denies changes in her vision. Her eye does feel drier than usual and she has been using eye drops for this with some relief. The area under her eye drains sometimes. The drainage is clear. Her eye does look puffy sometimes. The area under her eye is red and itchy. It is not painful. She denies fever.   Past Medical History:  Diagnosis Date  . Allergy   . Arthritis   . Carpal tunnel syndrome, bilateral     Past Surgical History:  Procedure Laterality Date  . right ankle surgery      Family History  Problem Relation Age of Onset  . Cancer Mother        Breast   . Hyperlipidemia Father     Social History   Socioeconomic History  . Marital status: Married    Spouse name: Not on file  . Number of children: Not on file  . Years of education: Not on file  . Highest education level: Not on file  Occupational History  . Not on file  Tobacco Use  . Smoking status: Never Smoker  . Smokeless tobacco: Never Used  Vaping Use  . Vaping Use: Never used  Substance and Sexual Activity  . Alcohol use: No  . Drug use: No  . Sexual activity: Not on file  Other Topics Concern  . Not on file  Social History Narrative  . Not on file   Social Determinants of Health   Financial Resource Strain: Not on file  Food Insecurity: Not on file  Transportation Needs: Not on file  Physical Activity: Not on file  Stress: Not on file  Social Connections: Not on file  Intimate Partner Violence: Not on file    Outpatient Medications Prior to Visit  Medication Sig  Dispense Refill  . albuterol (VENTOLIN HFA) 108 (90 Base) MCG/ACT inhaler Inhale 2 puffs into the lungs every 6 (six) hours as needed for wheezing or shortness of breath. 18 g 2  . Ascorbic Acid (VITAMIN C) 1000 MG tablet Take 1,000 mg by mouth daily.    Marland Kitchen buPROPion (WELLBUTRIN XL) 300 MG 24 hr tablet Take 1 tablet (300 mg total) by mouth daily. 90 tablet 3  . busPIRone (BUSPAR) 10 MG tablet Take 1 tablet (10 mg total) by mouth 3 (three) times daily. Start with one daily for 3 days, then twice daily, then TID 90 tablet 5  . cetirizine (ZYRTEC) 10 MG tablet Take 10 mg by mouth daily.    . fluticasone (FLONASE) 50 MCG/ACT nasal spray Place into both nostrils daily as needed for allergies or rhinitis.    . methylphenidate 54 MG PO CR tablet Take 1 tablet (54 mg total) by mouth every morning. 30 tablet 0  . methylphenidate 54 MG PO CR tablet Take 1 tablet (54 mg total) by mouth every morning. 30 tablet 0  . methylphenidate 54 MG PO CR tablet Take 1 tablet (54 mg total) by mouth every morning. 30 tablet 0  .  triamcinolone ointment (KENALOG) 0.5 % Apply 1 application topically 2 (two) times daily. 30 g 0   No facility-administered medications prior to visit.    Allergies  Allergen Reactions  . Elemental Sulfur     ROS Review of Systems As per HPI.    Objective:    Physical Exam Vitals and nursing note reviewed.  Constitutional:      Appearance: She is well-developed.  HENT:     Head:   Eyes:     General: No scleral icterus.       Right eye: No foreign body or discharge.     Extraocular Movements: Extraocular movements intact.     Conjunctiva/sclera: Conjunctivae normal.     Right eye: Right conjunctiva is not injected. No exudate or hemorrhage.    Left eye: Left conjunctiva is not injected. No exudate or hemorrhage.    Pupils: Pupils are equal, round, and reactive to light.  Neck:     Trachea: Trachea normal.  Pulmonary:     Effort: Pulmonary effort is normal. No respiratory  distress.  Abdominal:     Palpations: There is no mass.  Musculoskeletal:        General: Normal range of motion.     Cervical back: Full passive range of motion without pain.  Skin:    General: Skin is warm and dry.  Neurological:     General: No focal deficit present.     Mental Status: She is alert and oriented to person, place, and time.     Deep Tendon Reflexes: Reflexes are normal and symmetric.  Psychiatric:        Mood and Affect: Mood normal.        Behavior: Behavior normal.        Thought Content: Thought content normal.        Judgment: Judgment normal.     BP 111/61   Pulse 81   Temp 97.8 F (36.6 C) (Temporal)   Ht 5\' 1"  (1.549 m)   Wt 247 lb 2 oz (112.1 kg)   LMP  (LMP Unknown)   BMI 46.69 kg/m  Wt Readings from Last 3 Encounters:  11/14/20 247 lb 2 oz (112.1 kg)  03/03/20 239 lb 6.4 oz (108.6 kg)  08/08/19 210 lb (95.3 kg)     Health Maintenance Due  Topic Date Due  . Hepatitis C Screening  Never done  . COVID-19 Vaccine (1) Never done  . TETANUS/TDAP  Never done  . COLONOSCOPY (Pts 45-53yrs Insurance coverage will need to be confirmed)  Never done  . MAMMOGRAM  04/24/2019    There are no preventive care reminders to display for this patient.  Lab Results  Component Value Date   TSH 1.570 11/08/2017   Lab Results  Component Value Date   WBC 6.5 11/08/2017   HGB 13.0 11/08/2017   HCT 39.2 11/08/2017   MCV 90 11/08/2017   PLT 317 11/08/2017   Lab Results  Component Value Date   NA 137 03/03/2020   K 4.2 03/03/2020   CO2 24 03/03/2020   GLUCOSE 87 03/03/2020   BUN 17 03/03/2020   CREATININE 0.86 03/03/2020   BILITOT 0.4 03/03/2020   ALKPHOS 127 (H) 03/03/2020   AST 15 03/03/2020   ALT 12 03/03/2020   PROT 7.1 03/03/2020   ALBUMIN 4.4 03/03/2020   CALCIUM 9.2 03/03/2020   Lab Results  Component Value Date   CHOL 157 11/08/2017   Lab Results  Component Value Date   HDL  71 11/08/2017   Lab Results  Component Value Date    LDLCALC 79 11/08/2017   Lab Results  Component Value Date   TRIG 35 11/08/2017   Lab Results  Component Value Date   CHOLHDL 2.2 11/08/2017   No results found for: HGBA1C    Assessment & Plan:   Erica Galloway was seen today for eye problem.  Diagnoses and all orders for this visit:  Animal scratch/Eye problem Area under right eye concerning for infection. Start augmentin daily. Conjuctiva and sclera appear normal today. No tenderness or pain with EOMs. Referral to ophthalmology placed.  -     Ambulatory referral to Ophthalmology -     amoxicillin-clavulanate (AUGMENTIN) 875-125 MG tablet; Take 1 tablet by mouth 2 (two) times daily.   Follow-up: Return if symptoms worsen or fail to improve.    Gabriel Earing, FNP

## 2020-11-18 ENCOUNTER — Other Ambulatory Visit: Payer: Self-pay | Admitting: Family Medicine

## 2020-11-18 DIAGNOSIS — H579 Unspecified disorder of eye and adnexa: Secondary | ICD-10-CM

## 2020-11-18 DIAGNOSIS — T148XXA Other injury of unspecified body region, initial encounter: Secondary | ICD-10-CM

## 2020-11-21 MED ORDER — AMOXICILLIN-POT CLAVULANATE 875-125 MG PO TABS: 1.0000 | ORAL_TABLET | Freq: Two times a day (BID) | ORAL | 0 refills | Status: AC

## 2020-11-21 NOTE — Addendum Note (Signed)
Addended by: Gabriel Earing on: 11/21/2020 03:33 PM   Modules accepted: Orders

## 2020-11-21 NOTE — Telephone Encounter (Signed)
Erica Galloway patient.  Taking antibiotic for Eyelid

## 2020-11-21 NOTE — Telephone Encounter (Signed)
I did give her a short course initially, so I will refill Augmentin for an additional 5 days for her. Her eyelid will continue to heal after the completion of the antibiotics. A visit will be needed for any further refills after today.

## 2020-11-21 NOTE — Telephone Encounter (Signed)
Pt is wanting to continue this for her eyelid - I denied initially, what are your thought?

## 2020-12-02 ENCOUNTER — Encounter: Payer: Self-pay | Admitting: Family

## 2020-12-02 ENCOUNTER — Ambulatory Visit: Payer: BC Managed Care – PPO | Admitting: Family

## 2020-12-02 ENCOUNTER — Other Ambulatory Visit: Payer: Self-pay

## 2020-12-02 VITALS — BP 98/67 | HR 81 | Temp 98.0°F | Ht 61.0 in | Wt 239.0 lb

## 2020-12-02 DIAGNOSIS — Z1211 Encounter for screening for malignant neoplasm of colon: Secondary | ICD-10-CM

## 2020-12-02 DIAGNOSIS — M159 Polyosteoarthritis, unspecified: Secondary | ICD-10-CM

## 2020-12-02 DIAGNOSIS — F411 Generalized anxiety disorder: Secondary | ICD-10-CM

## 2020-12-02 DIAGNOSIS — Z1159 Encounter for screening for other viral diseases: Secondary | ICD-10-CM | POA: Diagnosis not present

## 2020-12-02 DIAGNOSIS — Z79899 Other long term (current) drug therapy: Secondary | ICD-10-CM | POA: Diagnosis not present

## 2020-12-02 DIAGNOSIS — M150 Primary generalized (osteo)arthritis: Secondary | ICD-10-CM

## 2020-12-02 DIAGNOSIS — F902 Attention-deficit hyperactivity disorder, combined type: Secondary | ICD-10-CM

## 2020-12-02 DIAGNOSIS — J4520 Mild intermittent asthma, uncomplicated: Secondary | ICD-10-CM

## 2020-12-02 DIAGNOSIS — M8949 Other hypertrophic osteoarthropathy, multiple sites: Secondary | ICD-10-CM

## 2020-12-02 MED ORDER — METHYLPHENIDATE HCL ER (OSM) 54 MG PO TBCR: 54.0000 mg | EXTENDED_RELEASE_TABLET | ORAL | 0 refills | Status: DC

## 2020-12-02 NOTE — Patient Instructions (Signed)
Health Maintenance, Female Adopting a healthy lifestyle and getting preventive care are important in promoting health and wellness. Ask your health care provider about:  The right schedule for you to have regular tests and exams.  Things you can do on your own to prevent diseases and keep yourself healthy. What should I know about diet, weight, and exercise? Eat a healthy diet  Eat a diet that includes plenty of vegetables, fruits, low-fat dairy products, and lean protein.  Do not eat a lot of foods that are high in solid fats, added sugars, or sodium.   Maintain a healthy weight Body mass index (BMI) is used to identify weight problems. It estimates body fat based on height and weight. Your health care provider can help determine your BMI and help you achieve or maintain a healthy weight. Get regular exercise Get regular exercise. This is one of the most important things you can do for your health. Most adults should:  Exercise for at least 150 minutes each week. The exercise should increase your heart rate and make you sweat (moderate-intensity exercise).  Do strengthening exercises at least twice a week. This is in addition to the moderate-intensity exercise.  Spend less time sitting. Even light physical activity can be beneficial. Watch cholesterol and blood lipids Have your blood tested for lipids and cholesterol at 52 years of age, then have this test every 5 years. Have your cholesterol levels checked more often if:  Your lipid or cholesterol levels are high.  You are older than 52 years of age.  You are at high risk for heart disease. What should I know about cancer screening? Depending on your health history and family history, you may need to have cancer screening at various ages. This may include screening for:  Breast cancer.  Cervical cancer.  Colorectal cancer.  Skin cancer.  Lung cancer. What should I know about heart disease, diabetes, and high blood  pressure? Blood pressure and heart disease  High blood pressure causes heart disease and increases the risk of stroke. This is more likely to develop in people who have high blood pressure readings, are of African descent, or are overweight.  Have your blood pressure checked: ? Every 3-5 years if you are 18-39 years of age. ? Every year if you are 40 years old or older. Diabetes Have regular diabetes screenings. This checks your fasting blood sugar level. Have the screening done:  Once every three years after age 40 if you are at a normal weight and have a low risk for diabetes.  More often and at a younger age if you are overweight or have a high risk for diabetes. What should I know about preventing infection? Hepatitis B If you have a higher risk for hepatitis B, you should be screened for this virus. Talk with your health care provider to find out if you are at risk for hepatitis B infection. Hepatitis C Testing is recommended for:  Everyone born from 1945 through 1965.  Anyone with known risk factors for hepatitis C. Sexually transmitted infections (STIs)  Get screened for STIs, including gonorrhea and chlamydia, if: ? You are sexually active and are younger than 52 years of age. ? You are older than 52 years of age and your health care provider tells you that you are at risk for this type of infection. ? Your sexual activity has changed since you were last screened, and you are at increased risk for chlamydia or gonorrhea. Ask your health care provider   if you are at risk.  Ask your health care provider about whether you are at high risk for HIV. Your health care provider may recommend a prescription medicine to help prevent HIV infection. If you choose to take medicine to prevent HIV, you should first get tested for HIV. You should then be tested every 3 months for as long as you are taking the medicine. Pregnancy  If you are about to stop having your period (premenopausal) and  you may become pregnant, seek counseling before you get pregnant.  Take 400 to 800 micrograms (mcg) of folic acid every day if you become pregnant.  Ask for birth control (contraception) if you want to prevent pregnancy. Osteoporosis and menopause Osteoporosis is a disease in which the bones lose minerals and strength with aging. This can result in bone fractures. If you are 65 years old or older, or if you are at risk for osteoporosis and fractures, ask your health care provider if you should:  Be screened for bone loss.  Take a calcium or vitamin D supplement to lower your risk of fractures.  Be given hormone replacement therapy (HRT) to treat symptoms of menopause. Follow these instructions at home: Lifestyle  Do not use any products that contain nicotine or tobacco, such as cigarettes, e-cigarettes, and chewing tobacco. If you need help quitting, ask your health care provider.  Do not use street drugs.  Do not share needles.  Ask your health care provider for help if you need support or information about quitting drugs. Alcohol use  Do not drink alcohol if: ? Your health care provider tells you not to drink. ? You are pregnant, may be pregnant, or are planning to become pregnant.  If you drink alcohol: ? Limit how much you use to 0-1 drink a day. ? Limit intake if you are breastfeeding.  Be aware of how much alcohol is in your drink. In the [place] one drink equals one 12 oz bottle of beer (355 mL), one 5 oz glass of wine (148 mL), or one 1 oz glass of hard liquor (44 mL). General instructions  Schedule regular health, dental, and eye exams.  Stay current with your vaccines.  Tell your health care provider if: ? You often feel depressed. ? You have ever been abused or do not feel safe at home. Summary  Adopting a healthy lifestyle and getting preventive care are important in promoting health and wellness.  Follow your health care provider's instructions about healthy  diet, exercising, and getting tested or screened for diseases.  Follow your health care provider's instructions on monitoring your cholesterol and blood pressure. This information is not intended to replace advice given to you by your health care provider. Make sure you discuss any questions you have with your health care provider. Document Revised: 09/20/2018 Document Reviewed: 09/20/2018 Elsevier Patient Education  2021 Elsevier Inc.  

## 2020-12-02 NOTE — Progress Notes (Signed)
Subjective:    Patient ID: Erica Galloway, female    DOB: 29-Jul-1971, 50 y.o.   MRN: [id number]  No chief complaint on file.  PT presents to the office today for chronic follow up.  She has ADHD and is  currently taking Concerta 54 mg daily. This helps her stay focused and on task. Asthma There is no cough, shortness of breath or wheezing. This is a chronic problem. The current episode started more than 1 year ago. The problem occurs intermittently. Her past medical history is significant for asthma.  Anxiety Presents for follow-up visit. Symptoms include decreased concentration. Patient reports no excessive worry, irritability, nervous/anxious behavior, restlessness or shortness of breath. Symptoms occur most days. The severity of symptoms is moderate. The quality of sleep is good.   Her past medical history is significant for asthma.  Arthritis Presents for follow-up visit. She complains of pain and stiffness. The symptoms have been stable. Affected locations include the left knee and right knee. Her pain is at a severity of 8/10.      Review of Systems  Constitutional: Negative for irritability.  Respiratory: Negative for cough, shortness of breath and wheezing.   Musculoskeletal: Positive for arthritis and stiffness.  Psychiatric/Behavioral: Positive for decreased concentration. The patient is not nervous/anxious.   All other systems reviewed and are negative.      Objective:   Physical Exam Vitals reviewed.  Constitutional:      General: She is not in acute distress.    Appearance: She is well-developed and well-nourished. She is obese.  HENT:     Head: Normocephalic and atraumatic.     Right Ear: Tympanic membrane normal.     Left Ear: Tympanic membrane normal.     Mouth/Throat:     Mouth: Oropharynx is clear and moist.  Eyes:     Pupils: Pupils are equal, round, and reactive to light.  Neck:     Thyroid: No thyromegaly.  Cardiovascular:     Rate and Rhythm:  Normal rate and regular rhythm.     Pulses: Intact distal pulses.     Heart sounds: Normal heart sounds. No murmur heard.   Pulmonary:     Effort: Pulmonary effort is normal. No respiratory distress.     Breath sounds: Normal breath sounds. No wheezing.  Abdominal:     General: Bowel sounds are normal. There is no distension.     Palpations: Abdomen is soft.     Tenderness: There is no abdominal tenderness.  Musculoskeletal:        General: No tenderness or edema. Normal range of motion.     Cervical back: Normal range of motion and neck supple.  Skin:    General: Skin is warm and dry.  Neurological:     Mental Status: She is alert and oriented to person, place, and time.     Cranial Nerves: No cranial nerve deficit.     Deep Tendon Reflexes: Reflexes are normal and symmetric.  Psychiatric:        Mood and Affect: Mood and affect normal.        Behavior: Behavior normal.        Thought Content: Thought content normal.        Judgment: Judgment normal.       BP 98/67   Pulse 81   Temp 98 F (36.7 C) (Temporal)   Ht '5\' 1"'  (1.549 m)   Wt 239 lb (108.4 kg)   LMP  (LMP Unknown)  SpO2 99%   BMI 45.16 kg/m      Assessment & Plan:  Erica Galloway comes in today with chief complaint of No chief complaint on file.   Diagnosis and orders addressed:  1. Mild intermittent asthma, unspecified whether complicated - VOH60+VPXT - CBC with Differential/Platelet  2. Primary osteoarthritis involving multiple joints - CMP14+EGFR - CBC with Differential/Platelet  3. Morbid obesity ([place]) - CMP14+EGFR - CBC with Differential/Platelet  4. GAD (generalized anxiety disorder) - CMP14+EGFR - CBC with Differential/Platelet - Methylphenidate + Mtb, Urine  5. Controlled substance agreement signed - CMP14+EGFR - CBC with Differential/Platelet - Methylphenidate + Mtb, Urine - methylphenidate 54 MG PO CR tablet; Take 1 tablet (54 mg total) by mouth every morning.  Dispense: 30  tablet; Refill: 0 - methylphenidate 54 MG PO CR tablet; Take 1 tablet (54 mg total) by mouth every morning.  Dispense: 30 tablet; Refill: 0 - methylphenidate 54 MG PO CR tablet; Take 1 tablet (54 mg total) by mouth every morning.  Dispense: 30 tablet; Refill: 0  6. Colon cancer screening - Cologuard - CMP14+EGFR - CBC with Differential/Platelet  7. Need for hepatitis C screening test - CMP14+EGFR - CBC with Differential/Platelet - Hepatitis C antibody  8. Attention deficit hyperactivity disorder (ADHD), combined type Meds as prescribed Behavior modification as needed Follow-up for recheck in 3 months - methylphenidate 54 MG PO CR tablet; Take 1 tablet (54 mg total) by mouth every morning.  Dispense: 30 tablet; Refill: 0 - methylphenidate 54 MG PO CR tablet; Take 1 tablet (54 mg total) by mouth every morning.  Dispense: 30 tablet; Refill: 0 - methylphenidate 54 MG PO CR tablet; Take 1 tablet (54 mg total) by mouth every morning.  Dispense: 30 tablet; Refill: 0   Labs pending Patient reviewed in [place] controlled database, no flags noted. Contract and drug screen are up to date.   Health Maintenance reviewed Diet and exercise encouraged  Follow up plan: 3 month    Erica Dun, FNP

## 2020-12-03 LAB — CBC WITH DIFFERENTIAL/PLATELET
Basophils Absolute: 0 10*3/uL (ref 0.0–0.2)
Basos: 1 %
EOS (ABSOLUTE): 0.1 10*3/uL (ref 0.0–0.4)
Eos: 2 %
Hematocrit: 40.5 % (ref 34.0–46.6)
Hemoglobin: 13.7 g/dL (ref 11.1–15.9)
Immature Grans (Abs): 0 10*3/uL (ref 0.0–0.1)
Immature Granulocytes: 0 %
Lymphocytes Absolute: 0.9 10*3/uL (ref 0.7–3.1)
Lymphs: 14 %
MCH: 29.6 pg (ref 26.6–33.0)
MCHC: 33.8 g/dL (ref 31.5–35.7)
MCV: 88 fL (ref 79–97)
Monocytes Absolute: 0.7 10*3/uL (ref 0.1–0.9)
Monocytes: 11 %
Neutrophils Absolute: 4.7 10*3/uL (ref 1.4–7.0)
Neutrophils: 72 %
Platelets: 354 10*3/uL (ref 150–450)
RBC: 4.63 x10E6/uL (ref 3.77–5.28)
RDW: 12 % (ref 11.7–15.4)
WBC: 6.4 10*3/uL (ref 3.4–10.8)

## 2020-12-03 LAB — CMP14+EGFR
ALT: 14 IU/L (ref 0–32)
AST: 20 IU/L (ref 0–40)
Albumin/Globulin Ratio: 1.7 (ref 1.2–2.2)
Albumin: 4.7 g/dL (ref 3.8–4.9)
Alkaline Phosphatase: 107 IU/L (ref 44–121)
BUN/Creatinine Ratio: 24 — ABNORMAL HIGH (ref 9–23)
BUN: 20 mg/dL (ref 6–24)
Bilirubin Total: 0.5 mg/dL (ref 0.0–1.2)
CO2: 23 mmol/L (ref 20–29)
Calcium: 9.9 mg/dL (ref 8.7–10.2)
Chloride: 102 mmol/L (ref 96–106)
Creatinine, Ser: 0.85 mg/dL (ref 0.57–1.00)
GFR calc Af Amer: 92 mL/min/{1.73_m2} (ref >59)
GFR calc non Af Amer: 80 mL/min/{1.73_m2} (ref >59)
Globulin, Total: 2.7 g/dL (ref 1.5–4.5)
Glucose: 87 mg/dL (ref 65–99)
Potassium: 5.2 mmol/L (ref 3.5–5.2)
Sodium: 140 mmol/L (ref 134–144)
Total Protein: 7.4 g/dL (ref 6.0–8.5)

## 2020-12-03 LAB — HEPATITIS C ANTIBODY: Hep C Virus Ab: <0.1 s/co ratio (ref 0.0–0.9)

## 2020-12-11 LAB — METHYLPHENIDATE + MTB, URINE
Methylphenidate: >2000 ng/mL
Ritalinic Acid: >10000 ng/mL

## 2020-12-13 DIAGNOSIS — Z1211 Encounter for screening for malignant neoplasm of colon: Secondary | ICD-10-CM | POA: Diagnosis not present

## 2020-12-29 LAB — COLOGUARD: Cologuard: NEGATIVE

## 2021-01-08 ENCOUNTER — Other Ambulatory Visit: Payer: Self-pay | Admitting: Family

## 2021-01-08 DIAGNOSIS — Z1231 Encounter for screening mammogram for malignant neoplasm of breast: Secondary | ICD-10-CM

## 2021-02-04 ENCOUNTER — Ambulatory Visit
Admission: RE | Admit: 2021-02-04 | Discharge: 2021-02-04 | Disposition: A | Discharge status: Home / Self Care | Payer: BC Managed Care – PPO | Source: Ambulatory Visit | Attending: Family | Admitting: Family

## 2021-02-04 ENCOUNTER — Other Ambulatory Visit: Payer: Self-pay

## 2021-02-04 DIAGNOSIS — Z1231 Encounter for screening mammogram for malignant neoplasm of breast: Secondary | ICD-10-CM | POA: Diagnosis not present

## 2021-03-02 ENCOUNTER — Ambulatory Visit: Payer: BC Managed Care – PPO | Admitting: Family

## 2021-03-02 ENCOUNTER — Encounter: Payer: Self-pay | Admitting: Family

## 2021-03-02 ENCOUNTER — Other Ambulatory Visit: Payer: Self-pay

## 2021-03-02 VITALS — BP 111/68 | HR 81 | Temp 98.0°F | Ht 61.0 in | Wt 236.4 lb

## 2021-03-02 DIAGNOSIS — M8949 Other hypertrophic osteoarthropathy, multiple sites: Secondary | ICD-10-CM

## 2021-03-02 DIAGNOSIS — Z79899 Other long term (current) drug therapy: Secondary | ICD-10-CM | POA: Diagnosis not present

## 2021-03-02 DIAGNOSIS — M25512 Pain in left shoulder: Secondary | ICD-10-CM

## 2021-03-02 DIAGNOSIS — J4520 Mild intermittent asthma, uncomplicated: Secondary | ICD-10-CM | POA: Diagnosis not present

## 2021-03-02 DIAGNOSIS — G8929 Other chronic pain: Secondary | ICD-10-CM

## 2021-03-02 DIAGNOSIS — F902 Attention-deficit hyperactivity disorder, combined type: Secondary | ICD-10-CM | POA: Diagnosis not present

## 2021-03-02 DIAGNOSIS — M159 Polyosteoarthritis, unspecified: Secondary | ICD-10-CM

## 2021-03-02 DIAGNOSIS — F411 Generalized anxiety disorder: Secondary | ICD-10-CM | POA: Diagnosis not present

## 2021-03-02 MED ORDER — CLOBETASOL PROPIONATE 0.05 % EX CREA: 1.0000 "application " | TOPICAL_CREAM | Freq: Two times a day (BID) | CUTANEOUS | 0 refills | Status: DC

## 2021-03-02 MED ORDER — METHYLPHENIDATE HCL ER (OSM) 54 MG PO TBCR: 54.0000 mg | EXTENDED_RELEASE_TABLET | ORAL | 0 refills | Status: DC

## 2021-03-02 MED ORDER — BUSPIRONE HCL 10 MG PO TABS: 10.0000 mg | ORAL_TABLET | Freq: Three times a day (TID) | ORAL | 5 refills | Status: DC

## 2021-03-02 MED ORDER — BUPROPION HCL ER (XL) 300 MG PO TB24: 300.0000 mg | ORAL_TABLET | Freq: Every day | ORAL | 3 refills | Status: DC

## 2021-03-02 NOTE — Progress Notes (Signed)
Subjective:    Patient ID: Erica Galloway, female    DOB: 1971/07/18, 50 y.o.   MRN: [id number]  Chief Complaint  Patient presents with  . Medical Management of Chronic Issues   PT presents to the office today for chronic follow up.  She has ADHD and is  currently taking Concerta 54 mg daily. This helps her stay focused and on task. Asthma There is no cough, shortness of breath or wheezing. This is a chronic problem. The current episode started more than 1 year ago. The problem occurs intermittently. She reports moderate improvement on treatment. Her past medical history is significant for asthma.  Arthritis Presents for follow-up visit. She complains of pain. The symptoms have been stable. Affected locations include the left knee, right knee, left MCP and right MCP. Her pain is at a severity of 2/10.  Anxiety Presents for follow-up visit. Symptoms include depressed mood, excessive worry, irritability and nervous/anxious behavior. Patient reports no shortness of breath. Symptoms occur most days. The severity of symptoms is moderate.   Her past medical history is significant for asthma.      Review of Systems  Constitutional: Positive for irritability.  Respiratory: Negative for cough, shortness of breath and wheezing.   Musculoskeletal: Positive for arthritis.  Psychiatric/Behavioral: The patient is nervous/anxious.   All other systems reviewed and are negative.      Objective:   Physical Exam Vitals reviewed.  Constitutional:      General: She is not in acute distress.    Appearance: She is well-developed. She is obese.  HENT:     Head: Normocephalic and atraumatic.     Right Ear: Tympanic membrane normal.     Left Ear: Tympanic membrane normal.  Eyes:     Pupils: Pupils are equal, round, and reactive to light.  Neck:     Thyroid: No thyromegaly.  Cardiovascular:     Rate and Rhythm: Normal rate and regular rhythm.     Heart sounds: Normal heart sounds. No murmur  heard.   Pulmonary:     Effort: Pulmonary effort is normal. No respiratory distress.     Breath sounds: Normal breath sounds. No wheezing.  Abdominal:     General: Bowel sounds are normal. There is no distension.     Palpations: Abdomen is soft.     Tenderness: There is no abdominal tenderness.  Musculoskeletal:        General: No tenderness. Normal range of motion.     Cervical back: Normal range of motion and neck supple.  Skin:    General: Skin is warm and dry.  Neurological:     Mental Status: She is alert and oriented to person, place, and time.     Cranial Nerves: No cranial nerve deficit.     Deep Tendon Reflexes: Reflexes are normal and symmetric.  Psychiatric:        Behavior: Behavior normal.        Thought Content: Thought content normal.        Judgment: Judgment normal.       BP 111/68   Pulse 81   Temp 98 F (36.7 C) (Temporal)   Ht 5\' 1"  (1.549 m)   Wt 236 lb 6.4 oz (107.2 kg)   LMP  (LMP Unknown)   BMI 44.67 kg/m      Assessment & Plan:  Erica Galloway comes in today with chief complaint of Medical Management of Chronic Issues   Diagnosis and orders addressed:  1.  Attention deficit hyperactivity disorder (ADHD), combined type - buPROPion (WELLBUTRIN XL) 300 MG 24 hr tablet; Take 1 tablet (300 mg total) by mouth daily.  Dispense: 90 tablet; Refill: 3 - methylphenidate 54 MG PO CR tablet; Take 1 tablet (54 mg total) by mouth every morning.  Dispense: 30 tablet; Refill: 0 - methylphenidate 54 MG PO CR tablet; Take 1 tablet (54 mg total) by mouth every morning.  Dispense: 30 tablet; Refill: 0 - methylphenidate 54 MG PO CR tablet; Take 1 tablet (54 mg total) by mouth every morning.  Dispense: 30 tablet; Refill: 0  2. Controlled substance agreement signed - methylphenidate 54 MG PO CR tablet; Take 1 tablet (54 mg total) by mouth every morning.  Dispense: 30 tablet; Refill: 0 - methylphenidate 54 MG PO CR tablet; Take 1 tablet (54 mg total) by mouth every  morning.  Dispense: 30 tablet; Refill: 0 - methylphenidate 54 MG PO CR tablet; Take 1 tablet (54 mg total) by mouth every morning.  Dispense: 30 tablet; Refill: 0  3. GAD (generalized anxiety disorder) - busPIRone (BUSPAR) 10 MG tablet; Take 1 tablet (10 mg total) by mouth 3 (three) times daily. Start with one daily for 3 days, then twice daily, then TID  Dispense: 90 tablet; Refill: 5  4. Mild intermittent asthma, unspecified whether complicated  5. Primary osteoarthritis involving multiple joints  6. Morbid obesity (HCC)  7. Chronic left shoulder pain   Patient reviewed in [place] controlled database, no flags noted. Contract and drug screen up dated today. Health Maintenance reviewed Diet and exercise encouraged  Follow up plan: 3 months    Jannifer Rodney, FNP

## 2021-03-02 NOTE — Patient Instructions (Signed)

## 2021-06-03 ENCOUNTER — Ambulatory Visit: Payer: BC Managed Care – PPO | Admitting: Family

## 2021-06-03 ENCOUNTER — Other Ambulatory Visit: Payer: Self-pay

## 2021-06-03 ENCOUNTER — Encounter: Payer: Self-pay | Admitting: Family

## 2021-06-03 VITALS — BP 115/64 | HR 90 | Temp 98.0°F | Ht 61.0 in | Wt 247.8 lb

## 2021-06-03 DIAGNOSIS — M8949 Other hypertrophic osteoarthropathy, multiple sites: Secondary | ICD-10-CM | POA: Diagnosis not present

## 2021-06-03 DIAGNOSIS — M159 Polyosteoarthritis, unspecified: Secondary | ICD-10-CM

## 2021-06-03 DIAGNOSIS — F902 Attention-deficit hyperactivity disorder, combined type: Secondary | ICD-10-CM

## 2021-06-03 DIAGNOSIS — F411 Generalized anxiety disorder: Secondary | ICD-10-CM

## 2021-06-03 DIAGNOSIS — Z79899 Other long term (current) drug therapy: Secondary | ICD-10-CM | POA: Diagnosis not present

## 2021-06-03 DIAGNOSIS — J4520 Mild intermittent asthma, uncomplicated: Secondary | ICD-10-CM | POA: Diagnosis not present

## 2021-06-03 MED ORDER — METHYLPHENIDATE HCL ER (OSM) 54 MG PO TBCR: 54.0000 mg | EXTENDED_RELEASE_TABLET | ORAL | 0 refills | Status: DC

## 2021-06-03 NOTE — Patient Instructions (Signed)
Health Maintenance, Female Adopting a healthy lifestyle and getting preventive care are important in promoting health and wellness. Ask your health care provider about: The right schedule for you to have regular tests and exams. Things you can do on your own to prevent diseases and keep yourself healthy. What should I know about diet, weight, and exercise? Eat a healthy diet  Eat a diet that includes plenty of vegetables, fruits, low-fat dairy products, and lean protein. Do not eat a lot of foods that are high in solid fats, added sugars, or sodium.  Maintain a healthy weight Body mass index (BMI) is used to identify weight problems. It estimates body fat based on height and weight. Your health care provider can help determineyour BMI and help you achieve or maintain a healthy weight. Get regular exercise Get regular exercise. This is one of the most important things you can do for your health. Most adults should: Exercise for at least 150 minutes each week. The exercise should increase your heart rate and make you sweat (moderate-intensity exercise). Do strengthening exercises at least twice a week. This is in addition to the moderate-intensity exercise. Spend less time sitting. Even light physical activity can be beneficial. Watch cholesterol and blood lipids Have your blood tested for lipids and cholesterol at 52 years of age, then havethis test every 5 years. Have your cholesterol levels checked more often if: Your lipid or cholesterol levels are high. You are older than 52 years of age. You are at high risk for heart disease. What should I know about cancer screening? Depending on your health history and family history, you may need to have cancer screening at various ages. This may include screening for: Breast cancer. Cervical cancer. Colorectal cancer. Skin cancer. Lung cancer. What should I know about heart disease, diabetes, and high blood pressure? Blood pressure and heart  disease High blood pressure causes heart disease and increases the risk of stroke. This is more likely to develop in people who have high blood pressure readings, are of African descent, or are overweight. Have your blood pressure checked: Every 3-5 years if you are 18-39 years of age. Every year if you are 40 years old or older. Diabetes Have regular diabetes screenings. This checks your fasting blood sugar level. Have the screening done: Once every three years after age 40 if you are at a normal weight and have a low risk for diabetes. More often and at a younger age if you are overweight or have a high risk for diabetes. What should I know about preventing infection? Hepatitis B If you have a higher risk for hepatitis B, you should be screened for this virus. Talk with your health care provider to find out if you are at risk forhepatitis B infection. Hepatitis C Testing is recommended for: Everyone born from 1945 through 1965. Anyone with known risk factors for hepatitis C. Sexually transmitted infections (STIs) Get screened for STIs, including gonorrhea and chlamydia, if: You are sexually active and are younger than 52 years of age. You are older than 52 years of age and your health care provider tells you that you are at risk for this type of infection. Your sexual activity has changed since you were last screened, and you are at increased risk for chlamydia or gonorrhea. Ask your health care provider if you are at risk. Ask your health care provider about whether you are at high risk for HIV. Your health care provider may recommend a prescription medicine to help   prevent HIV infection. If you choose to take medicine to prevent HIV, you should first get tested for HIV. You should then be tested every 3 months for as long as you are taking the medicine. Pregnancy If you are about to stop having your period (premenopausal) and you may become pregnant, seek counseling before you get  pregnant. Take 400 to 800 micrograms (mcg) of folic acid every day if you become pregnant. Ask for birth control (contraception) if you want to prevent pregnancy. Osteoporosis and menopause Osteoporosis is a disease in which the bones lose minerals and strength with aging. This can result in bone fractures. If you are 65 years old or older, or if you are at risk for osteoporosis and fractures, ask your health care provider if you should: Be screened for bone loss. Take a calcium or vitamin D supplement to lower your risk of fractures. Be given hormone replacement therapy (HRT) to treat symptoms of menopause. Follow these instructions at home: Lifestyle Do not use any products that contain nicotine or tobacco, such as cigarettes, e-cigarettes, and chewing tobacco. If you need help quitting, ask your health care provider. Do not use street drugs. Do not share needles. Ask your health care provider for help if you need support or information about quitting drugs. Alcohol use Do not drink alcohol if: Your health care provider tells you not to drink. You are pregnant, may be pregnant, or are planning to become pregnant. If you drink alcohol: Limit how much you use to 0-1 drink a day. Limit intake if you are breastfeeding. Be aware of how much alcohol is in your drink. In the [place] one drink equals one 12 oz bottle of beer (355 mL), one 5 oz glass of wine (148 mL), or one 1 oz glass of hard liquor (44 mL). General instructions Schedule regular health, dental, and eye exams. Stay current with your vaccines. Tell your health care provider if: You often feel depressed. You have ever been abused or do not feel safe at home. Summary Adopting a healthy lifestyle and getting preventive care are important in promoting health and wellness. Follow your health care provider's instructions about healthy diet, exercising, and getting tested or screened for diseases. Follow your health care provider's  instructions on monitoring your cholesterol and blood pressure. This information is not intended to replace advice given to you by your health care provider. Make sure you discuss any questions you have with your healthcare provider. Document Revised: 09/20/2018 Document Reviewed: 09/20/2018 Elsevier Patient Education  2022 [employer]vier Inc.  

## 2021-06-03 NOTE — Progress Notes (Signed)
Subjective:    Patient ID: Erica Galloway, female    DOB: 1971-04-22, 50 y.o.   MRN: [id number]  Chief Complaint  Patient presents with   Medical Management of Chronic Issues   PT presents to the office today for chronic follow up.  She has ADHD and is  currently taking Concerta 54 mg daily. This helps her stay focused and on task. Asthma There is no cough, shortness of breath or wheezing. This is a chronic problem. The current episode started more than 1 year ago. Her symptoms are aggravated by exercise. Her symptoms are alleviated by beta-agonist. She reports significant improvement on treatment. Her past medical history is significant for asthma.  Arthritis Presents for follow-up visit. She complains of pain and stiffness. The symptoms have been stable. Affected locations include the left knee and right knee. Her pain is at a severity of 2/10.  Anxiety Presents for follow-up visit. Symptoms include excessive worry, irritability and nervous/anxious behavior. Patient reports no shortness of breath.   Her past medical history is significant for asthma.     Review of Systems  Constitutional:  Positive for irritability.  Respiratory:  Negative for cough, shortness of breath and wheezing.   Musculoskeletal:  Positive for arthritis and stiffness.  Psychiatric/Behavioral:  The patient is nervous/anxious.   All other systems reviewed and are negative.     Objective:   Physical Exam Vitals reviewed.  Constitutional:      General: She is not in acute distress.    Appearance: She is well-developed. She is obese.  HENT:     Head: Normocephalic and atraumatic.     Right Ear: Tympanic membrane normal.     Left Ear: Tympanic membrane normal.  Eyes:     Pupils: Pupils are equal, round, and reactive to light.  Neck:     Thyroid: No thyromegaly.  Cardiovascular:     Rate and Rhythm: Normal rate and regular rhythm.     Heart sounds: Normal heart sounds. No murmur heard. Pulmonary:      Effort: Pulmonary effort is normal. No respiratory distress.     Breath sounds: Normal breath sounds. No wheezing.  Abdominal:     General: Bowel sounds are normal. There is no distension.     Palpations: Abdomen is soft.     Tenderness: There is no abdominal tenderness.  Musculoskeletal:        General: No tenderness. Normal range of motion.     Cervical back: Normal range of motion and neck supple.  Skin:    General: Skin is warm and dry.  Neurological:     Mental Status: She is alert and oriented to person, place, and time.     Cranial Nerves: No cranial nerve deficit.     Deep Tendon Reflexes: Reflexes are normal and symmetric.  Psychiatric:        Behavior: Behavior normal.        Thought Content: Thought content normal.        Judgment: Judgment normal.         BP 115/64   Pulse 90   Temp 98 F (36.7 C)   Ht 5\' 1"  (1.549 m)   Wt 247 lb 12.8 oz (112.4 kg)   LMP  (LMP Unknown)   SpO2 99%   BMI 46.82 kg/m   Assessment & Plan:  Erica Galloway comes in today with chief complaint of Medical Management of Chronic Issues   Diagnosis and orders addressed:  1. Attention deficit  hyperactivity disorder (ADHD), combined type Meds as prescribed Behavior modification as needed Follow-up for recheck in 3 months - methylphenidate 54 MG PO CR tablet; Take 1 tablet (54 mg total) by mouth every morning.  Dispense: 30 tablet; Refill: 0 - methylphenidate 54 MG PO CR tablet; Take 1 tablet (54 mg total) by mouth every morning.  Dispense: 30 tablet; Refill: 0 - methylphenidate 54 MG PO CR tablet; Take 1 tablet (54 mg total) by mouth every morning.  Dispense: 30 tablet; Refill: 0  2. Controlled substance agreement signed - methylphenidate 54 MG PO CR tablet; Take 1 tablet (54 mg total) by mouth every morning.  Dispense: 30 tablet; Refill: 0 - methylphenidate 54 MG PO CR tablet; Take 1 tablet (54 mg total) by mouth every morning.  Dispense: 30 tablet; Refill: 0 - methylphenidate 54 MG  PO CR tablet; Take 1 tablet (54 mg total) by mouth every morning.  Dispense: 30 tablet; Refill: 0  3. Mild intermittent asthma, unspecified whether complicated   4. Primary osteoarthritis involving multiple joints  5. GAD (generalized anxiety disorder)  6. Morbid obesity (HCC)   Patient reviewed in [place] controlled database, no flags noted. Contract and drug screen are up to date.  Health Maintenance reviewed Diet and exercise encouraged  Follow up plan: 3 months    Jannifer Rodney, FNP

## 2021-09-01 ENCOUNTER — Other Ambulatory Visit: Payer: Self-pay

## 2021-09-01 ENCOUNTER — Ambulatory Visit: Payer: BC Managed Care – PPO | Admitting: Family

## 2021-09-01 ENCOUNTER — Other Ambulatory Visit: Payer: Self-pay | Admitting: Family

## 2021-09-01 ENCOUNTER — Encounter: Payer: Self-pay | Admitting: Family

## 2021-09-01 VITALS — BP 121/77 | HR 84 | Temp 97.8°F | Ht 61.0 in | Wt 252.6 lb

## 2021-09-01 DIAGNOSIS — Z79899 Other long term (current) drug therapy: Secondary | ICD-10-CM

## 2021-09-01 DIAGNOSIS — J4520 Mild intermittent asthma, uncomplicated: Secondary | ICD-10-CM | POA: Diagnosis not present

## 2021-09-01 DIAGNOSIS — F411 Generalized anxiety disorder: Secondary | ICD-10-CM | POA: Diagnosis not present

## 2021-09-01 DIAGNOSIS — L089 Local infection of the skin and subcutaneous tissue, unspecified: Secondary | ICD-10-CM

## 2021-09-01 DIAGNOSIS — M159 Polyosteoarthritis, unspecified: Secondary | ICD-10-CM

## 2021-09-01 DIAGNOSIS — F902 Attention-deficit hyperactivity disorder, combined type: Secondary | ICD-10-CM | POA: Diagnosis not present

## 2021-09-01 MED ORDER — METHYLPHENIDATE HCL ER (OSM) 54 MG PO TBCR: 54.0000 mg | EXTENDED_RELEASE_TABLET | ORAL | 0 refills | Status: DC

## 2021-09-01 MED ORDER — BUPROPION HCL ER (XL) 300 MG PO TB24: 300.0000 mg | ORAL_TABLET | Freq: Every day | ORAL | 3 refills | Status: DC

## 2021-09-01 MED ORDER — MUPIROCIN CALCIUM 2 % EX CREA: 1.0000 "application " | TOPICAL_CREAM | Freq: Two times a day (BID) | CUTANEOUS | 1 refills | Status: DC

## 2021-09-01 MED ORDER — BUSPIRONE HCL 10 MG PO TABS: 10.0000 mg | ORAL_TABLET | Freq: Three times a day (TID) | ORAL | 5 refills | Status: DC

## 2021-09-01 MED ORDER — CLOBETASOL PROPIONATE 0.05 % EX CREA: 1.0000 "application " | TOPICAL_CREAM | Freq: Two times a day (BID) | CUTANEOUS | 0 refills | Status: AC

## 2021-09-01 NOTE — Progress Notes (Signed)
Subjective:    Patient ID: Erica Galloway, female    DOB: 1971-05-08, 50 y.o.   MRN: [id number]  Chief Complaint  Patient presents with   Medical Management of Chronic Issues   soreness in belly button    PT presents to the office today for chronic follow up.  She has ADHD and is  currently taking Concerta 54 mg daily. This helps her stay focused and on task.  Pt is complaining of irration of her belly button over the last few months on and off with erythemas and discharge. She reports she has tried OTC medication with mild relief.   She is morbid obese with a BMI of 47.  Asthma She complains of cough. There is no wheezing. This is a chronic problem. The current episode started more than 1 year ago. The problem occurs intermittently. Her symptoms are alleviated by rest. Her symptoms are not alleviated by rest. Her past medical history is significant for asthma.  Arthritis Presents for follow-up visit. She complains of pain and stiffness. The symptoms have been stable. Affected locations include the left knee, left MCP and right MCP. Her pain is at a severity of 2/10.  Anxiety Presents for follow-up visit. Symptoms include excessive worry, irritability and nervous/anxious behavior.   Her past medical history is significant for asthma.     Review of Systems  Constitutional:  Positive for irritability.  Respiratory:  Positive for cough. Negative for wheezing.   Musculoskeletal:  Positive for arthritis and stiffness.  Psychiatric/Behavioral:  The patient is nervous/anxious.   All other systems reviewed and are negative.     Objective:   Physical Exam Vitals reviewed.  Constitutional:      General: She is not in acute distress.    Appearance: She is well-developed. She is obese.  HENT:     Head: Normocephalic and atraumatic.     Right Ear: Tympanic membrane normal.     Left Ear: Tympanic membrane normal.  Eyes:     Pupils: Pupils are equal, round, and reactive to light.   Neck:     Thyroid: No thyromegaly.  Cardiovascular:     Rate and Rhythm: Normal rate and regular rhythm.     Heart sounds: Normal heart sounds. No murmur heard. Pulmonary:     Effort: Pulmonary effort is normal. No respiratory distress.     Breath sounds: Normal breath sounds. No wheezing.  Abdominal:     General: Bowel sounds are normal. There is no distension.     Palpations: Abdomen is soft.     Tenderness: There is no abdominal tenderness.  Musculoskeletal:        General: No tenderness. Normal range of motion.     Cervical back: Normal range of motion and neck supple.  Skin:    General: Skin is warm and dry.  Neurological:     Mental Status: She is alert and oriented to person, place, and time.     Cranial Nerves: No cranial nerve deficit.     Deep Tendon Reflexes: Reflexes are normal and symmetric.  Psychiatric:        Behavior: Behavior normal.        Thought Content: Thought content normal.        Judgment: Judgment normal.     BP 121/77   Pulse 84   Temp 97.8 F (36.6 C) (Temporal)   Ht 5\' 1"  (1.549 m)   Wt 252 lb 9.6 oz (114.6 kg)   LMP  (LMP Unknown)  BMI 47.73 kg/m      Assessment & Plan:  Erica Galloway comes in today with chief complaint of Medical Management of Chronic Issues and soreness in belly button    Diagnosis and orders addressed:  1. Attention deficit hyperactivity disorder (ADHD), combined type -Meds as prescribed Behavior modification as needed Follow-up for recheck in 3 months - buPROPion (WELLBUTRIN XL) 300 MG 24 hr tablet; Take 1 tablet (300 mg total) by mouth daily.  Dispense: 90 tablet; Refill: 3 - methylphenidate 54 MG PO CR tablet; Take 1 tablet (54 mg total) by mouth every morning.  Dispense: 30 tablet; Refill: 0 - methylphenidate 54 MG PO CR tablet; Take 1 tablet (54 mg total) by mouth every morning.  Dispense: 30 tablet; Refill: 0 - methylphenidate 54 MG PO CR tablet; Take 1 tablet (54 mg total) by mouth every morning.   Dispense: 30 tablet; Refill: 0  2. Controlled substance agreement signed - methylphenidate 54 MG PO CR tablet; Take 1 tablet (54 mg total) by mouth every morning.  Dispense: 30 tablet; Refill: 0 - methylphenidate 54 MG PO CR tablet; Take 1 tablet (54 mg total) by mouth every morning.  Dispense: 30 tablet; Refill: 0 - methylphenidate 54 MG PO CR tablet; Take 1 tablet (54 mg total) by mouth every morning.  Dispense: 30 tablet; Refill: 0  3. GAD (generalized anxiety disorder) - busPIRone (BUSPAR) 10 MG tablet; Take 1 tablet (10 mg total) by mouth 3 (three) times daily. Start with one daily for 3 days, then twice daily, then TID  Dispense: 90 tablet; Refill: 5  4. Mild intermittent asthma, unspecified whether complicated  5. Primary osteoarthritis involving multiple joints  6. Morbid obesity (HCC)  7. Skin infection Start Bactroban  Keep area clean and dry - mupirocin cream (BACTROBAN) 2 %; Apply 1 application topically 2 (two) times daily.  Dispense: 60 g; Refill: 1   Labs pending Health Maintenance reviewed Diet and exercise encouraged  Follow up plan: 3 months   Jannifer Rodney, FNP

## 2021-09-01 NOTE — Patient Instructions (Signed)

## 2021-09-02 ENCOUNTER — Other Ambulatory Visit: Payer: Self-pay | Admitting: *Deleted

## 2021-09-02 MED ORDER — MUPIROCIN 2 % EX OINT: 1.0000 "application " | TOPICAL_OINTMENT | Freq: Two times a day (BID) | CUTANEOUS | 0 refills | Status: DC

## 2021-09-09 ENCOUNTER — Telehealth: Payer: Self-pay | Admitting: Family Medicine

## 2021-09-14 ENCOUNTER — Other Ambulatory Visit: Payer: Self-pay | Admitting: Family

## 2021-09-14 MED ORDER — FLUCONAZOLE 150 MG PO TABS: 150.0000 mg | ORAL_TABLET | ORAL | 0 refills | Status: DC | PRN

## 2021-09-14 MED ORDER — KETOCONAZOLE 2 % EX CREA: 1.0000 "application " | TOPICAL_CREAM | Freq: Every day | CUTANEOUS | 0 refills | Status: AC

## 2021-09-14 NOTE — Telephone Encounter (Signed)
Approvedon December 4 PA Case: [id number], Status: Approved, Coverage Starts on: 09/13/2021 12:00:00 AM, Coverage Ends on: 09/13/2022 12:00:00 AM

## 2021-10-08 DIAGNOSIS — F902 Attention-deficit hyperactivity disorder, combined type: Secondary | ICD-10-CM

## 2021-10-08 DIAGNOSIS — Z79899 Other long term (current) drug therapy: Secondary | ICD-10-CM

## 2021-10-09 ENCOUNTER — Other Ambulatory Visit: Payer: Self-pay | Admitting: Family

## 2021-10-09 MED ORDER — METHYLPHENIDATE HCL ER (OSM) 54 MG PO TBCR: 54.0000 mg | EXTENDED_RELEASE_TABLET | ORAL | 0 refills | Status: DC

## 2021-11-24 ENCOUNTER — Ambulatory Visit: Payer: BC Managed Care – PPO | Admitting: Family

## 2021-11-26 ENCOUNTER — Ambulatory Visit: Payer: BC Managed Care – PPO | Admitting: Family

## 2021-11-26 ENCOUNTER — Encounter: Payer: Self-pay | Admitting: Family

## 2021-11-26 VITALS — BP 120/69 | HR 80 | Temp 98.3°F | Wt 238.8 lb

## 2021-11-26 DIAGNOSIS — Z0001 Encounter for general adult medical examination with abnormal findings: Secondary | ICD-10-CM

## 2021-11-26 DIAGNOSIS — F411 Generalized anxiety disorder: Secondary | ICD-10-CM

## 2021-11-26 DIAGNOSIS — Z0000 Encounter for general adult medical examination without abnormal findings: Secondary | ICD-10-CM

## 2021-11-26 DIAGNOSIS — M159 Polyosteoarthritis, unspecified: Secondary | ICD-10-CM

## 2021-11-26 DIAGNOSIS — F902 Attention-deficit hyperactivity disorder, combined type: Secondary | ICD-10-CM | POA: Diagnosis not present

## 2021-11-26 DIAGNOSIS — Z79899 Other long term (current) drug therapy: Secondary | ICD-10-CM | POA: Diagnosis not present

## 2021-11-26 DIAGNOSIS — L309 Dermatitis, unspecified: Secondary | ICD-10-CM

## 2021-11-26 DIAGNOSIS — J4520 Mild intermittent asthma, uncomplicated: Secondary | ICD-10-CM

## 2021-11-26 MED ORDER — METHYLPHENIDATE HCL ER (OSM) 54 MG PO TBCR: 54.0000 mg | EXTENDED_RELEASE_TABLET | ORAL | 0 refills | Status: AC

## 2021-11-26 MED ORDER — BUSPIRONE HCL 10 MG PO TABS: 10.0000 mg | ORAL_TABLET | Freq: Three times a day (TID) | ORAL | 5 refills | Status: AC

## 2021-11-26 MED ORDER — BUPROPION HCL ER (XL) 300 MG PO TB24: 300.0000 mg | ORAL_TABLET | Freq: Every day | ORAL | 3 refills | Status: AC

## 2021-11-26 MED ORDER — TRIAMCINOLONE ACETONIDE 0.5 % EX OINT: 1.0000 "application " | TOPICAL_OINTMENT | Freq: Two times a day (BID) | CUTANEOUS | 2 refills | Status: AC

## 2021-11-26 NOTE — Addendum Note (Signed)
Addended by: Ignacia Bayley on: 11/26/2021 04:05 PM   Modules accepted: Orders

## 2021-11-26 NOTE — Patient Instructions (Signed)
Eczema Eczema refers to a group of skin conditions that cause skin to become rough and inflamed. Each type of eczema has different triggers, symptoms, and treatments. Eczema of any type is usually itchy. Symptoms range from mild to severe. Eczema is not spread from person to person (is not contagious). It can appear on different parts of the body at different times. One person's eczema may look different from another person's eczema. What are the causes? The exact cause of this condition is not known. However, exposure to certain environmental factors, irritants, and allergens can make the condition worse. What are the signs or symptoms? Symptoms of this condition depend on the type of eczema you have. The types include: Contact dermatitis. There are two kinds: Irritant contact dermatitis. This happens when something irritates the skin and causes a rash. Allergic contact dermatitis. This happens when your skin comes in contact with something you are allergic to (allergens). This can include poison ivy, chemicals, or medicines that were applied to your skin. Atopic dermatitis. This is a long-term (chronic) skin disease that keeps coming back (recurring). It is the most common type of eczema. Usual symptoms are a red rash and itchy, dry, scaly skin. It usually starts showing signs in infancy and can last through adulthood. Dyshidrotic eczema. This is a form of eczema on the hands and feet. It shows up as very itchy, fluid-filled blisters. It can affect people of any age but is more common before age 40. Hand eczema. This causes very itchy areas of skin on the palms and sides of the hands and fingers. This type of eczema is common in industrial jobs where you may be exposed to different types of irritants. Lichen simplex chronicus. This type of eczema occurs when a person constantly scratches one area of the body. Repeated scratching of the area leads to thickened skin (lichenification). This condition can  accompany other types of eczema. It is more common in adults but may also be seen in children. Nummular eczema. This is a common type of eczema that most often affects the lower legs and the backs of the hands. It typically causes an itchy, red, circular, crusty lesion (plaque). Scratching may become a habit and can cause bleeding. Nummular eczema occurs most often in middle-aged or older people. Seborrheic dermatitis. This is a common skin disease that mainly affects the scalp. It may also affect other oily areas of the body, such as the face, sides of the nose, eyebrows, ears, eyelids, and chest. It is marked by small scaling and redness of the skin (erythema). This can affect people of all ages. In infants, this condition is called cradle cap. Stasis dermatitis. This is a common skin disease that can cause itching, scaling, and hyperpigmentation, usually on the legs and feet. It occurs most often in people who have a condition that prevents blood from being pumped through the veins in the legs (chronic venous insufficiency). Stasis dermatitis is a chronic condition that needs long-term management. How is this diagnosed? This condition may be diagnosed based on: A physical exam of your skin. Your medical history. Skin patch tests. These tests involve using patches that contain possible allergens and placing them on your back. Your health care provider will check in a few days to see if an allergic reaction occurred. How is this treated? Treatment for eczema is based on the type of eczema you have. You may be given hydrocortisone steroid medicine or antihistamines. These can relieve itching quickly and help reduce inflammation.   These may be prescribed or purchased over the counter, depending on the strength that is needed. Follow these instructions at home: Take or apply over-the-counter and prescription medicines only as told by your health care provider. Use creams or ointments to moisturize your  skin. Do not use lotions. Learn what triggers or irritates your symptoms so you can avoid these things. Treat symptom flare-ups quickly. Do not scratch your skin. This can make your rash worse. Keep all follow-up visits. This is important. Where to find more information American Academy of Dermatology: aad.org National Eczema Association: nationaleczema.org The Society for Pediatric Dermatology: pedsderm.net Contact a health care provider if: You have severe itching, even with treatment. You scratch your skin regularly until it bleeds. Your rash looks different than usual. Your skin is painful, swollen, or more red than usual. You have a fever. Summary Eczema refers to a group of skin conditions that cause skin to become rough and inflamed. Each type has different triggers. Eczema of any type causes itching that may range from mild to severe. Treatment varies based on the type of eczema you have. Hydrocortisone steroid medicine or antihistamines can help with itching and inflammation. Protecting your skin is the best way to prevent eczema. Use creams or ointments to moisturize your skin. Avoid triggers and irritants. Treat flare-ups quickly. This information is not intended to replace advice given to you by your health care provider. Make sure you discuss any questions you have with your health care provider. Document Revised: 07/07/2020 Document Reviewed: 07/07/2020 Elsevier Patient Education  2022 Elsevier Inc.  

## 2021-11-26 NOTE — Progress Notes (Signed)
Subjective:    Patient ID: Erica Galloway, female    DOB: 06-10-1971, 51 y.o.   MRN: [id number]  Chief Complaint  Patient presents with   Medical Management of Chronic Issues   PT presents to the office today for CPE without pap and chronic follow up.  She has ADHD and is  currently taking Concerta 54 mg daily. This helps her stay focused and on task.  She is complaining of increase eczema that is worse in the morning.    She is morbid obese with a BMI of 45.  Asthma There is no difficulty breathing or hoarse voice. This is a chronic problem. The current episode started more than 1 year ago. The problem has been resolved. Her past medical history is significant for asthma.  Arthritis Presents for follow-up visit. She complains of pain and stiffness. Affected locations include the left knee and right knee. Her pain is at a severity of 1/10.  Anxiety Presents for follow-up visit. Symptoms include nervous/anxious behavior. Patient reports no excessive worry or irritability. Symptoms occur occasionally. The severity of symptoms is moderate.   Her past medical history is significant for asthma.     Review of Systems  Constitutional:  Negative for irritability.  HENT:  Negative for hoarse voice.   Musculoskeletal:  Positive for arthritis and stiffness.  Psychiatric/Behavioral:  The patient is nervous/anxious.   All other systems reviewed and are negative.     Objective:   Physical Exam Vitals reviewed.  Constitutional:      General: She is not in acute distress.    Appearance: She is well-developed. She is obese.  HENT:     Head: Normocephalic and atraumatic.     Right Ear: Tympanic membrane normal.     Left Ear: Tympanic membrane normal.  Eyes:     Pupils: Pupils are equal, round, and reactive to light.  Neck:     Thyroid: No thyromegaly.  Cardiovascular:     Rate and Rhythm: Normal rate and regular rhythm.     Heart sounds: Normal heart sounds. No murmur  heard. Pulmonary:     Effort: Pulmonary effort is normal. No respiratory distress.     Breath sounds: Normal breath sounds. No wheezing.  Abdominal:     General: Bowel sounds are normal. There is no distension.     Palpations: Abdomen is soft.     Tenderness: There is no abdominal tenderness.  Musculoskeletal:        General: No tenderness. Normal range of motion.     Cervical back: Normal range of motion and neck supple.  Skin:    General: Skin is warm and dry.     Findings: Rash present.       Neurological:     Mental Status: She is alert and oriented to person, place, and time.     Cranial Nerves: No cranial nerve deficit.     Deep Tendon Reflexes: Reflexes are normal and symmetric.  Psychiatric:        Behavior: Behavior normal.        Thought Content: Thought content normal.        Judgment: Judgment normal.      BP 120/69    Pulse 80    Temp 98.3 F (36.8 C) (Temporal)    Wt 238 lb 12.8 oz (108.3 kg)    LMP  (LMP Unknown)    BMI 45.12 kg/m      Assessment & Plan:  Erica Galloway comes in  today with chief complaint of Medical Management of Chronic Issues   Diagnosis and orders addressed:  1. Attention deficit hyperactivity disorder (ADHD), combined type - buPROPion (WELLBUTRIN XL) 300 MG 24 hr tablet; Take 1 tablet (300 mg total) by mouth daily.  Dispense: 90 tablet; Refill: 3 - methylphenidate 54 MG PO CR tablet; Take 1 tablet (54 mg total) by mouth every morning.  Dispense: 30 tablet; Refill: 0 - methylphenidate 54 MG PO CR tablet; Take 1 tablet (54 mg total) by mouth every morning.  Dispense: 30 tablet; Refill: 0 - methylphenidate 54 MG PO CR tablet; Take 1 tablet (54 mg total) by mouth every morning.  Dispense: 30 tablet; Refill: 0 - CMP14+EGFR - CBC with Differential/Platelet - Methylphenidate + Mtb, Urine  2. Controlled substance agreement signed - methylphenidate 54 MG PO CR tablet; Take 1 tablet (54 mg total) by mouth every morning.  Dispense: 30 tablet;  Refill: 0 - methylphenidate 54 MG PO CR tablet; Take 1 tablet (54 mg total) by mouth every morning.  Dispense: 30 tablet; Refill: 0 - methylphenidate 54 MG PO CR tablet; Take 1 tablet (54 mg total) by mouth every morning.  Dispense: 30 tablet; Refill: 0 - CMP14+EGFR - CBC with Differential/Platelet - Methylphenidate + Mtb, Urine  3. GAD (generalized anxiety disorder) - busPIRone (BUSPAR) 10 MG tablet; Take 1 tablet (10 mg total) by mouth 3 (three) times daily. Start with one daily for 3 days, then twice daily, then TID  Dispense: 90 tablet; Refill: 5 - CMP14+EGFR - CBC with Differential/Platelet  4. Mild intermittent asthma, unspecified whether complicated - QHK25+JDYN - CBC with Differential/Platelet  5. Primary osteoarthritis involving multiple joints - CMP14+EGFR - CBC with Differential/Platelet  6. Morbid obesity ([place]) - CMP14+EGFR - CBC with Differential/Platelet  7. Annual physical exam - CMP14+EGFR - CBC with Differential/Platelet - Lipid panel - TSH - Methylphenidate + Mtb, Urine  8. Eczema, unspecified type Kenalog  Avoid hot showers Apply thick lotion - triamcinolone ointment (KENALOG) 0.5 %; Apply 1 application topically 2 (two) times daily.  Dispense: 90 g; Refill: 2   Labs pending Health Maintenance reviewed Diet and exercise encouraged  Follow up plan: 3 months   Evelina Dun, FNP

## 2021-12-01 LAB — LIPID PANEL
Chol/HDL Ratio: 2.7 ratio (ref 0.0–4.4)
Cholesterol, Total: 177 mg/dL (ref 100–199)
HDL: 66 mg/dL (ref >39)
LDL Chol Calc (NIH): 98 mg/dL (ref 0–99)
Triglycerides: 66 mg/dL (ref 0–149)
VLDL Cholesterol Cal: 13 mg/dL (ref 5–40)

## 2021-12-01 LAB — CMP14+EGFR
ALT: 16 IU/L (ref 0–32)
AST: 21 IU/L (ref 0–40)
Albumin/Globulin Ratio: 1.7 (ref 1.2–2.2)
Albumin: 4.5 g/dL (ref 3.8–4.9)
Alkaline Phosphatase: 91 IU/L (ref 44–121)
BUN/Creatinine Ratio: 23 (ref 9–23)
BUN: 23 mg/dL (ref 6–24)
Bilirubin Total: 0.6 mg/dL (ref 0.0–1.2)
CO2: 22 mmol/L (ref 20–29)
Calcium: 9.4 mg/dL (ref 8.7–10.2)
Chloride: 101 mmol/L (ref 96–106)
Creatinine, Ser: 0.99 mg/dL (ref 0.57–1.00)
Globulin, Total: 2.6 g/dL (ref 1.5–4.5)
Glucose: 90 mg/dL (ref 70–99)
Potassium: 4.4 mmol/L (ref 3.5–5.2)
Sodium: 137 mmol/L (ref 134–144)
Total Protein: 7.1 g/dL (ref 6.0–8.5)
eGFR: 69 mL/min/{1.73_m2} (ref >59)

## 2021-12-01 LAB — CBC WITH DIFFERENTIAL/PLATELET
Basophils Absolute: 0 10*3/uL (ref 0.0–0.2)
Basos: 1 %
EOS (ABSOLUTE): 0.1 10*3/uL (ref 0.0–0.4)
Eos: 3 %
Hematocrit: 37 % (ref 34.0–46.6)
Hemoglobin: 12.3 g/dL (ref 11.1–15.9)
Immature Grans (Abs): 0 10*3/uL (ref 0.0–0.1)
Immature Granulocytes: 0 %
Lymphocytes Absolute: 0.7 10*3/uL (ref 0.7–3.1)
Lymphs: 15 %
MCH: 29.1 pg (ref 26.6–33.0)
MCHC: 33.2 g/dL (ref 31.5–35.7)
MCV: 88 fL (ref 79–97)
Monocytes Absolute: 0.7 10*3/uL (ref 0.1–0.9)
Monocytes: 13 %
Neutrophils Absolute: 3.4 10*3/uL (ref 1.4–7.0)
Neutrophils: 68 %
Platelets: 278 10*3/uL (ref 150–450)
RBC: 4.23 x10E6/uL (ref 3.77–5.28)
RDW: 12.2 % (ref 11.7–15.4)
WBC: 5 10*3/uL (ref 3.4–10.8)

## 2021-12-01 LAB — DRUG SCREEN 10 W/CONF, SERUM
Amphetamines, IA: NEGATIVE ng/mL
Barbiturates, IA: NEGATIVE ug/mL
Benzodiazepines, IA: NEGATIVE ng/mL
Cocaine & Metabolite, IA: NEGATIVE ng/mL
Methadone, IA: NEGATIVE ng/mL
Opiates, IA: NEGATIVE ng/mL
Oxycodones, IA: NEGATIVE ng/mL
Phencyclidine, IA: NEGATIVE ng/mL
Propoxyphene, IA: NEGATIVE ng/mL
THC(Marijuana) Metabolite, IA: NEGATIVE ng/mL

## 2021-12-01 LAB — TSH: TSH: 1.22 u[IU]/mL (ref 0.450–4.500)

## 2021-12-02 ENCOUNTER — Encounter: Payer: Self-pay | Admitting: Family Medicine

## 2021-12-02 ENCOUNTER — Ambulatory Visit: Payer: BC Managed Care – PPO | Admitting: Family Medicine

## 2021-12-02 DIAGNOSIS — J40 Bronchitis, not specified as acute or chronic: Secondary | ICD-10-CM | POA: Diagnosis not present

## 2021-12-02 DIAGNOSIS — J4520 Mild intermittent asthma, uncomplicated: Secondary | ICD-10-CM | POA: Diagnosis not present

## 2021-12-02 MED ORDER — PREDNISONE 20 MG PO TABS: ORAL_TABLET | ORAL | 0 refills | Status: DC

## 2021-12-02 MED ORDER — BENZONATATE 100 MG PO CAPS: 100.0000 mg | ORAL_CAPSULE | Freq: Three times a day (TID) | ORAL | 1 refills | Status: AC | PRN

## 2021-12-02 NOTE — Progress Notes (Signed)
Virtual Visit via telephone Note  I connected with Erica Galloway on 12/02/21 at 1335 by telephone and verified that I am speaking with the correct person using two identifiers. Erica Galloway is currently located at home and patient are currently with her during visit. The provider, Fransisca Kaufmann Landynn Dupler, MD is located in their office at time of visit.  Call ended at 1343  I discussed the limitations, risks, security and privacy concerns of performing an evaluation and management service by telephone and the availability of in person appointments. I also discussed with the patient that there may be a patient responsible charge related to this service. The patient expressed understanding and agreed to proceed.   History and Present Illness: Patient is calling in for cough and and congestion and sinus pressure and drainage. She has blood streaking. She was sick after her husband got sick and then she started 2 days ago.  She is taking mucinex and it is not helping. She is feeling like she is going to throw up from coughing and it is productive.  She denies fevers or body aches or chills. She is working at [employer].   1. Bronchitis   2. Mild intermittent asthma, unspecified whether complicated     Outpatient Encounter Medications as of 12/02/2021  Medication Sig   benzonatate (TESSALON PERLES) 100 MG capsule Take 1 capsule (100 mg total) by mouth 3 (three) times daily as needed for cough.   predniSONE (DELTASONE) 20 MG tablet 2 po at same time daily for 5 days   albuterol (VENTOLIN HFA) 108 (90 Base) MCG/ACT inhaler Inhale 2 puffs into the lungs every 6 (six) hours as needed for wheezing or shortness of breath.   Ascorbic Acid (VITAMIN C) 1000 MG tablet Take 1,000 mg by mouth daily.   buPROPion (WELLBUTRIN XL) 300 MG 24 hr tablet Take 1 tablet (300 mg total) by mouth daily.   busPIRone (BUSPAR) 10 MG tablet Take 1 tablet (10 mg total) by mouth 3 (three) times daily. Start with one daily for 3 days,  then twice daily, then TID   cetirizine (ZYRTEC) 10 MG tablet Take 10 mg by mouth daily.   clobetasol cream (TEMOVATE) [id number] % Apply 1 application topically 2 (two) times daily.   fluticasone (FLONASE) 50 MCG/ACT nasal spray Place into both nostrils daily as needed for allergies or rhinitis.   ketoconazole (NIZORAL) 2 % cream Apply 1 application topically daily.   loratadine (CLARITIN) 10 MG tablet Take 10 mg by mouth daily.   methylphenidate 54 MG PO CR tablet Take 1 tablet (54 mg total) by mouth every morning.   methylphenidate 54 MG PO CR tablet Take 1 tablet (54 mg total) by mouth every morning.   methylphenidate 54 MG PO CR tablet Take 1 tablet (54 mg total) by mouth every morning.   triamcinolone ointment (KENALOG) 0.5 % Apply 1 application topically 2 (two) times daily.   No facility-administered encounter medications on file as of 12/02/2021.    Review of Systems  Constitutional:  Negative for chills and fever.  HENT:  Positive for congestion, postnasal drip, rhinorrhea, sinus pressure, sneezing and sore throat. Negative for ear discharge and ear pain.   Eyes:  Negative for pain, redness and visual disturbance.  Respiratory:  Positive for cough. Negative for chest tightness and shortness of breath.   Cardiovascular:  Negative for chest pain and leg swelling.  Genitourinary:  Negative for difficulty urinating and dysuria.  Musculoskeletal:  Negative for back pain and  gait problem.  Skin:  Negative for rash.  Neurological:  Negative for light-headedness and headaches.  Psychiatric/Behavioral:  Negative for agitation and behavioral problems.   All other systems reviewed and are negative.  Observations/Objective: Patient sounds comfortable and in no acute distress.   Assessment and Plan: Problem List Items Addressed This Visit       Respiratory   Mild intermittent asthma   Relevant Medications   predniSONE (DELTASONE) 20 MG tablet   benzonatate (TESSALON PERLES) 100 MG capsule    Other Visit Diagnoses     Bronchitis    -  Primary   Relevant Medications   predniSONE (DELTASONE) 20 MG tablet   benzonatate (TESSALON PERLES) 100 MG capsule       Cough sounds deep and more in the chest like asthma, it sounds like her husband is better like he had a viral infection but then she has gotten it worse because of asthma.  Recommend she start using her albuterol inhaler and take the prednisone, also sent Tessalon Perles  If she develops any fevers or chills or worsens then let us know and we can take a deeper look into it Follow up plan: Return if symptoms worsen or fail to improve.     I discussed the assessment and treatment plan with the patient. The patient was provided an opportunity to ask questions and all were answered. The patient agreed with the plan and demonstrated an understanding of the instructions.   The patient was advised to call back or seek an in-person evaluation if the symptoms worsen or if the condition fails to improve as anticipated.  The above assessment and management plan was discussed with the patient. The patient verbalized understanding of and has agreed to the management plan. Patient is aware to call the clinic if symptoms persist or worsen. Patient is aware when to return to the clinic for a follow-up visit. Patient educated on when it is appropriate to go to the emergency department.    I provided 8 minutes of non-face-to-face time during this encounter.    Worthy Rancher, MD

## 2021-12-12 ENCOUNTER — Other Ambulatory Visit: Payer: Self-pay | Admitting: Family Medicine

## 2021-12-12 DIAGNOSIS — J40 Bronchitis, not specified as acute or chronic: Secondary | ICD-10-CM

## 2021-12-12 DIAGNOSIS — J4520 Mild intermittent asthma, uncomplicated: Secondary | ICD-10-CM

## 2021-12-14 MED ORDER — PREDNISONE 20 MG PO TABS: ORAL_TABLET | ORAL | 0 refills | Status: AC

## 2022-01-06 DIAGNOSIS — F902 Attention-deficit hyperactivity disorder, combined type: Secondary | ICD-10-CM | POA: Diagnosis not present

## 2022-01-06 DIAGNOSIS — F411 Generalized anxiety disorder: Secondary | ICD-10-CM | POA: Diagnosis not present

## 2022-01-17 IMAGING — MG MM DIGITAL SCREENING BILAT W/ TOMO AND CAD
8 series · 8 of 24 positions shown · non-contrast
Comparison: Previous exam(s).

CLINICAL DATA: Screening.

EXAM:
DIGITAL SCREENING BILATERAL MAMMOGRAM WITH TOMOSYNTHESIS AND CAD
TECHNIQUE: Bilateral screening digital craniocaudal and mediolateral oblique
mammograms were obtained. Bilateral screening digital breast
tomosynthesis was performed. The images were evaluated with
computer-aided detection.

[L CC synth-2D]
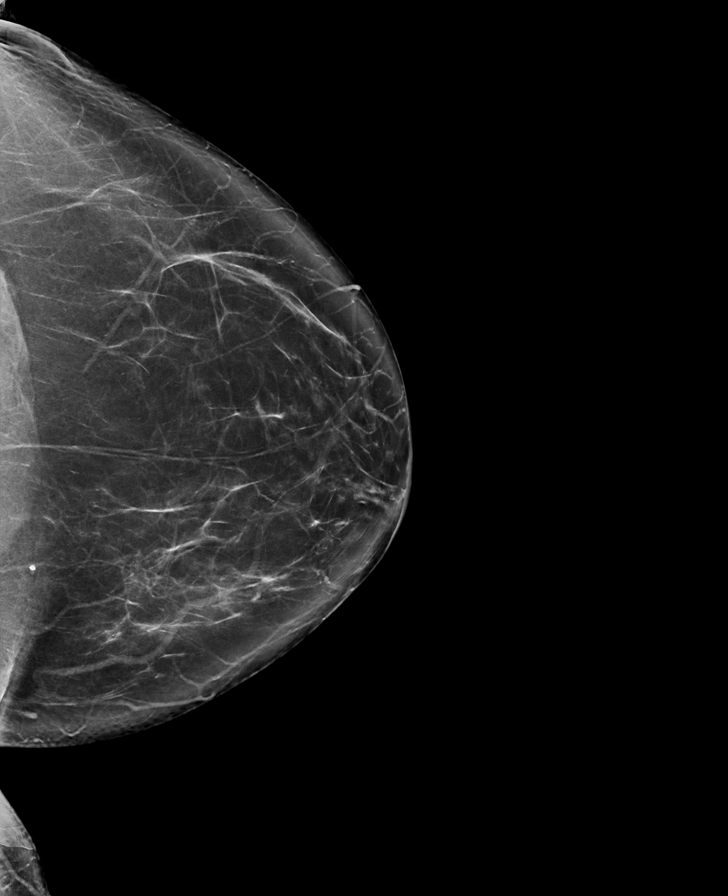

[R MLO synth-2D]
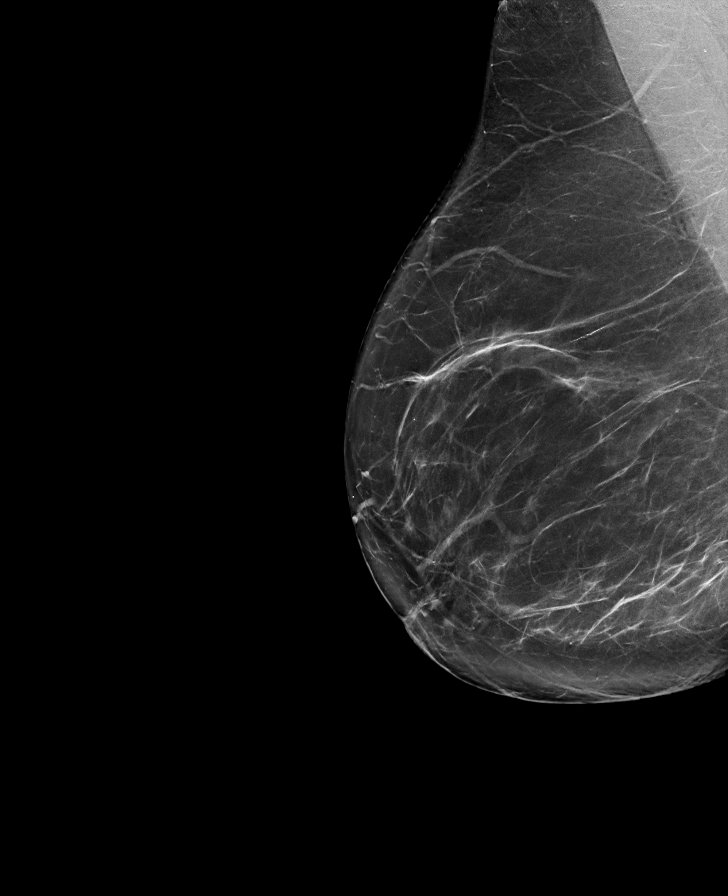

[L MLO synth-2D]
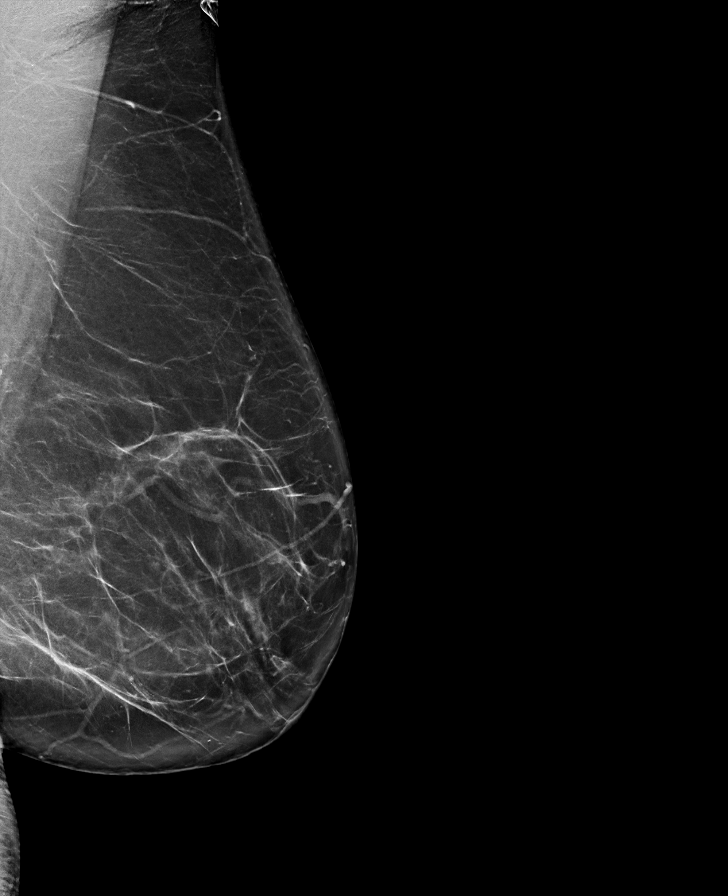

[R CC synth-2D]
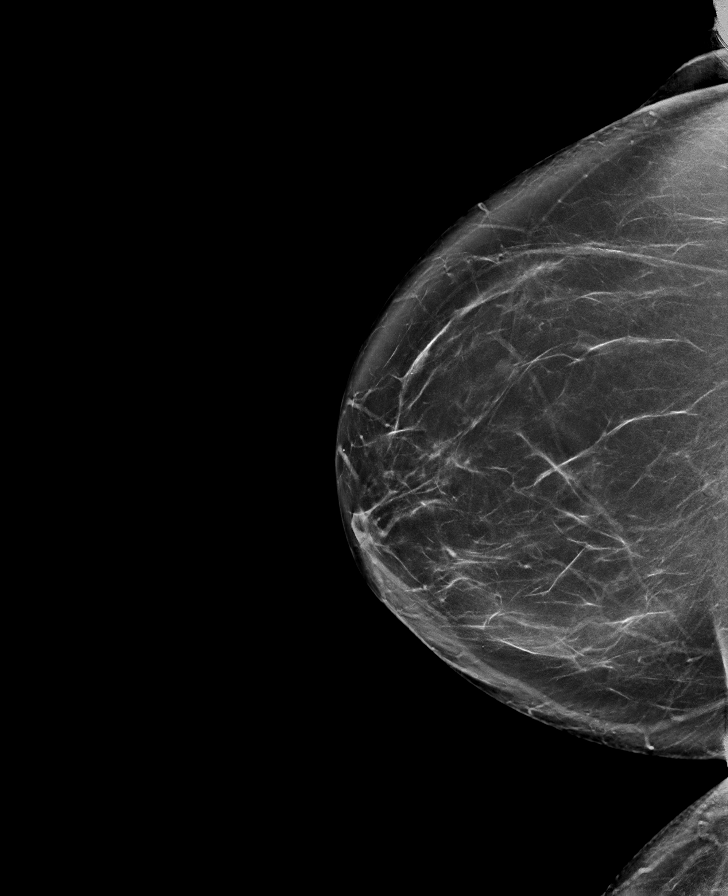

[R CC tomo · tomo slice 45/90.0]
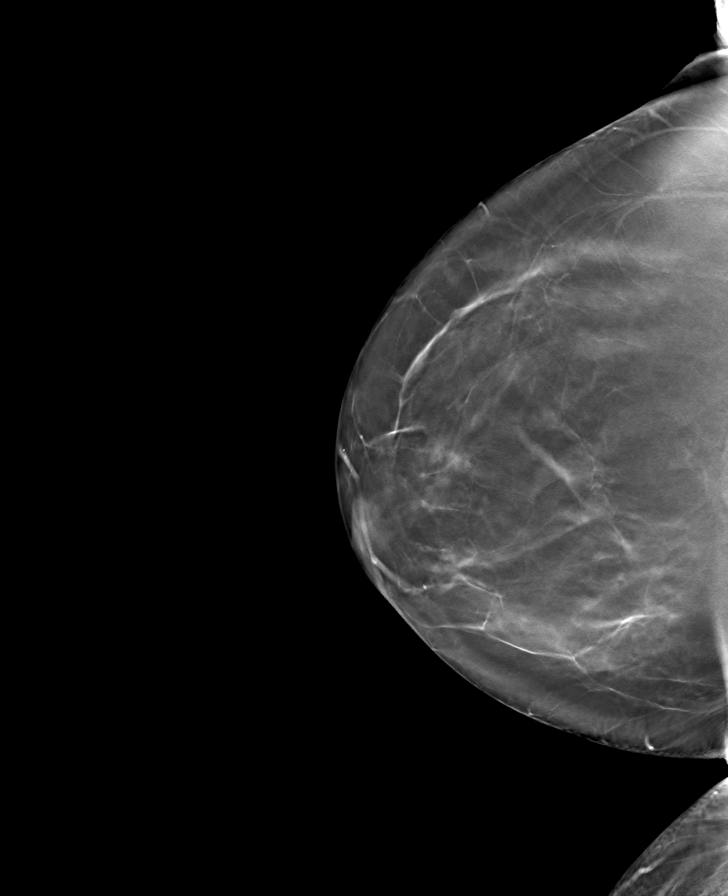

[L CC tomo · tomo slice 43/86.0]
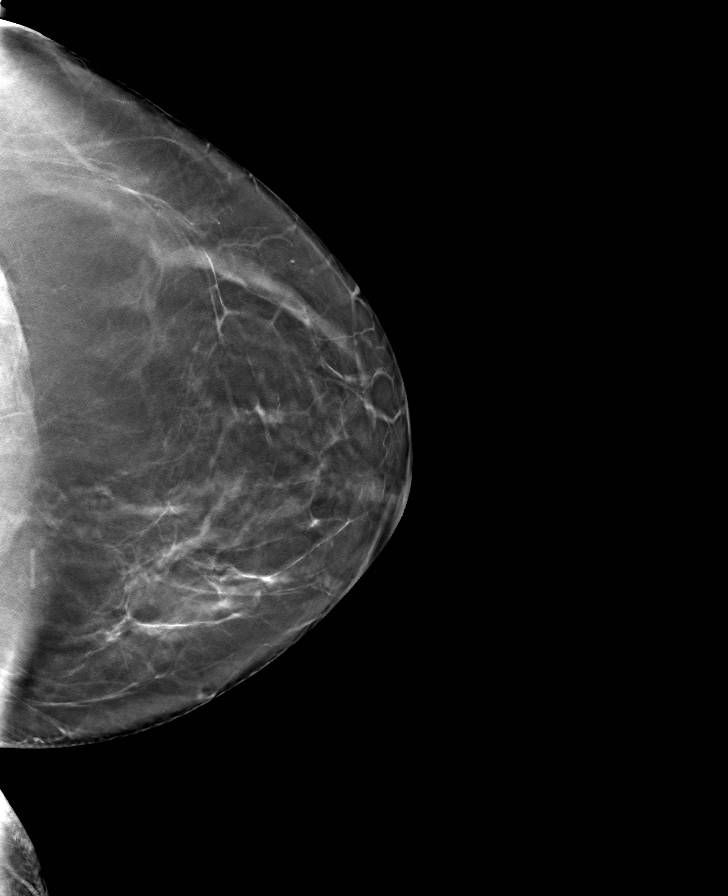

[R MLO tomo · tomo slice 43/85.0]
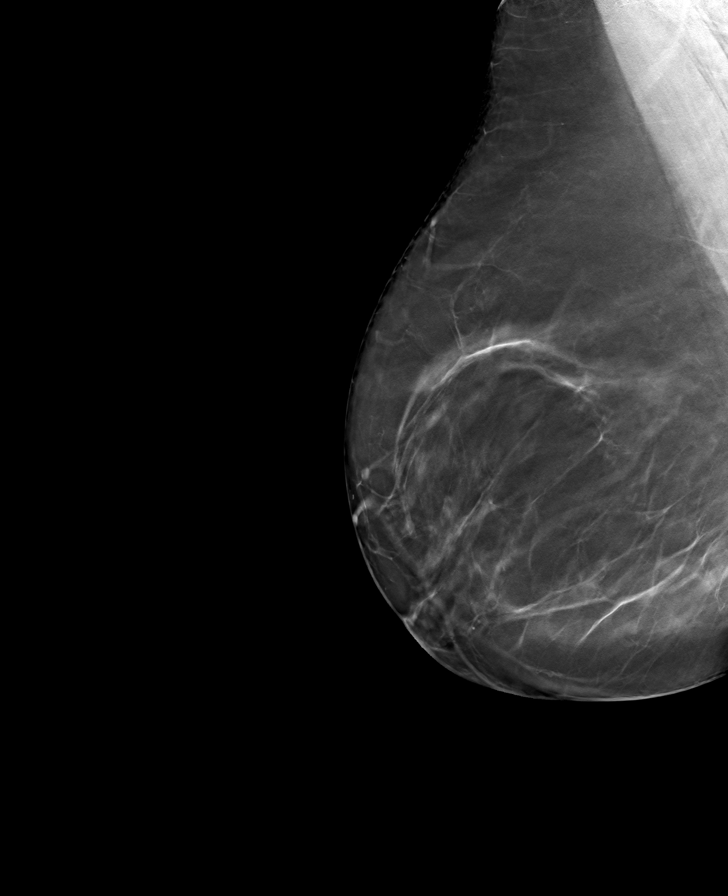

[L MLO tomo · tomo slice 43/85.0]
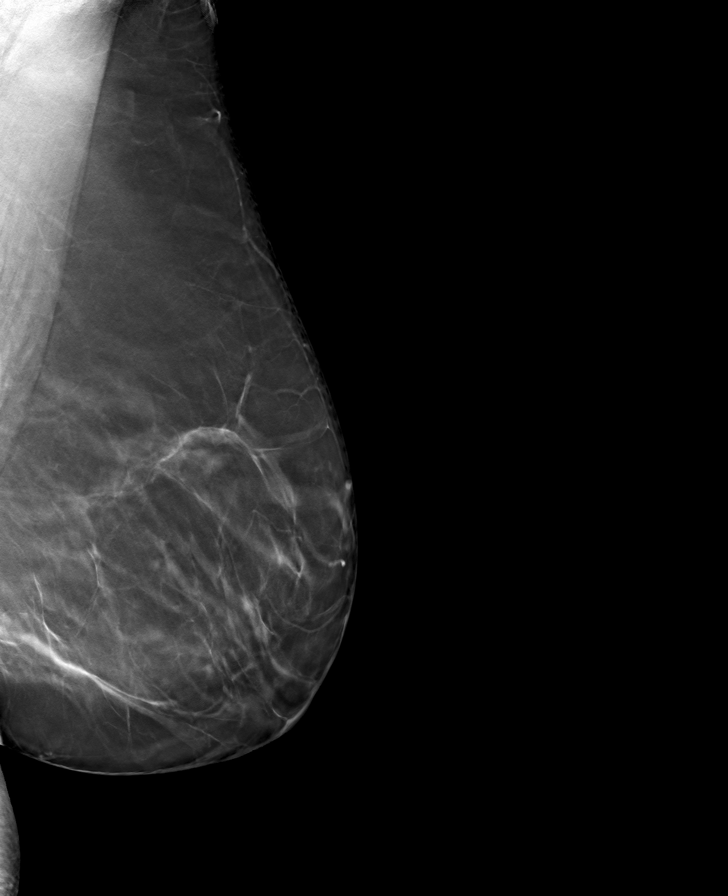

[8 of 24 positions shown; findings below may reference images not displayed]

ACR Breast Density Category b: There are scattered areas of
fibroglandular density.
FINDINGS: There are no findings suspicious for malignancy. The images were
evaluated with computer-aided detection.
IMPRESSION: No mammographic evidence of malignancy. A result letter of this
screening mammogram will be mailed directly to the patient.

RECOMMENDATION:
Screening mammogram in one year. (Code:[id number])

BI-RADS CATEGORY  1: Negative.

## 2022-02-25 ENCOUNTER — Ambulatory Visit: Payer: BC Managed Care – PPO | Admitting: Family

## 2022-02-26 DIAGNOSIS — L2089 Other atopic dermatitis: Secondary | ICD-10-CM | POA: Diagnosis not present

## 2022-02-26 DIAGNOSIS — F900 Attention-deficit hyperactivity disorder, predominantly inattentive type: Secondary | ICD-10-CM | POA: Diagnosis not present

## 2022-05-21 DIAGNOSIS — F900 Attention-deficit hyperactivity disorder, predominantly inattentive type: Secondary | ICD-10-CM | POA: Diagnosis not present

## 2022-05-21 DIAGNOSIS — B356 Tinea cruris: Secondary | ICD-10-CM | POA: Diagnosis not present

## 2022-05-21 DIAGNOSIS — K5901 Slow transit constipation: Secondary | ICD-10-CM | POA: Diagnosis not present

## 2022-05-21 DIAGNOSIS — L2089 Other atopic dermatitis: Secondary | ICD-10-CM | POA: Diagnosis not present

## 2022-09-06 DIAGNOSIS — F320 Major depressive disorder, single episode, mild: Secondary | ICD-10-CM | POA: Diagnosis not present

## 2022-09-06 DIAGNOSIS — F900 Attention-deficit hyperactivity disorder, predominantly inattentive type: Secondary | ICD-10-CM | POA: Diagnosis not present
